# Patient Record
Sex: Female | Born: 1965 | Race: Black or African American | Hispanic: No | Marital: Married | Smoking: Never smoker
Health system: Southern US, Community
[De-identification: ages and names within clinical notes are randomized; demographics above are authoritative.]

## PROBLEM LIST (undated history)

## (undated) DIAGNOSIS — R569 Unspecified convulsions: Secondary | ICD-10-CM

## (undated) DIAGNOSIS — D649 Anemia, unspecified: Secondary | ICD-10-CM

## (undated) DIAGNOSIS — N939 Abnormal uterine and vaginal bleeding, unspecified: Secondary | ICD-10-CM

## (undated) DIAGNOSIS — R519 Headache, unspecified: Secondary | ICD-10-CM

## (undated) DIAGNOSIS — E785 Hyperlipidemia, unspecified: Secondary | ICD-10-CM

## (undated) DIAGNOSIS — I10 Essential (primary) hypertension: Secondary | ICD-10-CM

## (undated) DIAGNOSIS — G43909 Migraine, unspecified, not intractable, without status migrainosus: Secondary | ICD-10-CM

## (undated) DIAGNOSIS — R51 Headache: Secondary | ICD-10-CM

## (undated) HISTORY — DX: Migraine, unspecified, not intractable, without status migrainosus: G43.909

## (undated) HISTORY — DX: Hyperlipidemia, unspecified: E78.5

## (undated) HISTORY — DX: Essential (primary) hypertension: I10

## (undated) HISTORY — DX: Headache, unspecified: R51.9

## (undated) HISTORY — PX: TUBAL LIGATION: SHX77

## (undated) HISTORY — DX: Abnormal uterine and vaginal bleeding, unspecified: N93.9

## (undated) HISTORY — DX: Headache: R51

## (undated) HISTORY — DX: Unspecified convulsions: R56.9

---

## 2008-04-06 ENCOUNTER — Emergency Department (HOSPITAL_COMMUNITY): Admission: EM | Admit: 2008-04-06 | Discharge: 2008-04-06 | Payer: Self-pay | Admitting: Emergency Medicine

## 2011-04-28 LAB — URINALYSIS, ROUTINE W REFLEX MICROSCOPIC
Bilirubin Urine: NEGATIVE
Nitrite: NEGATIVE
Protein, ur: NEGATIVE
Specific Gravity, Urine: 1.02
Urobilinogen, UA: 0.2

## 2011-04-28 LAB — BASIC METABOLIC PANEL
CO2: 21
Calcium: 9.7
Creatinine, Ser: 0.98
Glucose, Bld: 148 — ABNORMAL HIGH

## 2011-04-28 LAB — CBC
MCHC: 33.5
RDW: 13.4

## 2011-04-28 LAB — DIFFERENTIAL
Basophils Absolute: 0
Basophils Relative: 0
Eosinophils Absolute: 0.1
Monocytes Absolute: 0.7
Neutro Abs: 7.3
Neutrophils Relative %: 69

## 2011-04-28 LAB — RAPID URINE DRUG SCREEN, HOSP PERFORMED
Opiates: NOT DETECTED
Tetrahydrocannabinol: NOT DETECTED

## 2013-12-14 ENCOUNTER — Ambulatory Visit: Payer: Self-pay | Admitting: Physician Assistant

## 2014-01-11 ENCOUNTER — Encounter: Payer: Self-pay | Admitting: Family

## 2014-01-11 ENCOUNTER — Ambulatory Visit (INDEPENDENT_AMBULATORY_CARE_PROVIDER_SITE_OTHER): Payer: PRIVATE HEALTH INSURANCE | Admitting: Family

## 2014-01-11 VITALS — BP 152/92 | HR 73 | Ht 64.0 in | Wt 174.0 lb

## 2014-01-11 DIAGNOSIS — B353 Tinea pedis: Secondary | ICD-10-CM

## 2014-01-11 DIAGNOSIS — R51 Headache: Secondary | ICD-10-CM

## 2014-01-11 DIAGNOSIS — Z0000 Encounter for general adult medical examination without abnormal findings: Secondary | ICD-10-CM

## 2014-01-11 LAB — CBC WITH DIFFERENTIAL/PLATELET
BASOS PCT: 0.3 % (ref 0.0–3.0)
Basophils Absolute: 0 10*3/uL (ref 0.0–0.1)
EOS PCT: 1 % (ref 0.0–5.0)
Eosinophils Absolute: 0.1 10*3/uL (ref 0.0–0.7)
HEMATOCRIT: 32.1 % — AB (ref 36.0–46.0)
Hemoglobin: 10.6 g/dL — ABNORMAL LOW (ref 12.0–15.0)
LYMPHS ABS: 2.1 10*3/uL (ref 0.7–4.0)
Lymphocytes Relative: 32.5 % (ref 12.0–46.0)
MCHC: 32.9 g/dL (ref 30.0–36.0)
MCV: 91.1 fl (ref 78.0–100.0)
MONO ABS: 0.4 10*3/uL (ref 0.1–1.0)
Monocytes Relative: 5.9 % (ref 3.0–12.0)
NEUTROS ABS: 3.8 10*3/uL (ref 1.4–7.7)
Neutrophils Relative %: 60.3 % (ref 43.0–77.0)
Platelets: 364 10*3/uL (ref 150.0–400.0)
RBC: 3.52 Mil/uL — AB (ref 3.87–5.11)
RDW: 15.4 % (ref 11.5–15.5)
WBC: 6.4 10*3/uL (ref 4.0–10.5)

## 2014-01-11 LAB — TSH: TSH: 1.6 u[IU]/mL (ref 0.35–4.50)

## 2014-01-11 LAB — POCT URINALYSIS DIPSTICK
Bilirubin, UA: NEGATIVE
Blood, UA: NEGATIVE
Glucose, UA: NEGATIVE
KETONES UA: NEGATIVE
LEUKOCYTES UA: NEGATIVE
Nitrite, UA: NEGATIVE
PH UA: 7
PROTEIN UA: NEGATIVE
Spec Grav, UA: 1.02
Urobilinogen, UA: 0.2

## 2014-01-11 LAB — COMPREHENSIVE METABOLIC PANEL
ALK PHOS: 88 U/L (ref 39–117)
ALT: 15 U/L (ref 0–35)
AST: 23 U/L (ref 0–37)
Albumin: 4.1 g/dL (ref 3.5–5.2)
BILIRUBIN TOTAL: 0.6 mg/dL (ref 0.2–1.2)
BUN: 10 mg/dL (ref 6–23)
CO2: 27 mEq/L (ref 19–32)
Calcium: 9.1 mg/dL (ref 8.4–10.5)
Chloride: 104 mEq/L (ref 96–112)
Creatinine, Ser: 0.8 mg/dL (ref 0.4–1.2)
GFR: 96.34 mL/min (ref >60.00)
GLUCOSE: 89 mg/dL (ref 70–99)
Potassium: 3.9 mEq/L (ref 3.5–5.1)
SODIUM: 135 meq/L (ref 135–145)
Total Protein: 7.8 g/dL (ref 6.0–8.3)

## 2014-01-11 LAB — LIPID PANEL
Cholesterol: 184 mg/dL (ref 0–200)
HDL: 65.6 mg/dL (ref >39.00)
LDL CALC: 111 mg/dL — AB (ref 0–99)
NONHDL: 118.4
Total CHOL/HDL Ratio: 3
Triglycerides: 38 mg/dL (ref 0.0–149.0)
VLDL: 7.6 mg/dL (ref 0.0–40.0)

## 2014-01-11 MED ORDER — TRAMADOL HCL 50 MG PO TABS: 50.0000 mg | ORAL_TABLET | Freq: Three times a day (TID) | ORAL | Status: DC | PRN

## 2014-01-11 NOTE — Progress Notes (Signed)
Pre visit review using our clinic review tool, if applicable. No additional management support is needed unless otherwise documented below in the visit note. 

## 2014-01-11 NOTE — Progress Notes (Signed)
Subjective:    Patient ID: Kimberly Walker, female    DOB: Jan 27, 1966, 48 y.o.   MRN: [id number]  HPI 48 year old AAF, nonsmoker, is in today to be established. She has complaints of headaches that occur about once or twice a month lasting 3-5 days. She takes ibuprofen 800 mg 5 times per day when they occur. They typically occur in the frontal region. Denies any associated nausea, vomiting, sensitivity to light or noise, no blurred vision. The headaches that he had of 8.  Patient has concerns of fungus in between her toes on going several month. Has not tried anything to help.She works in a factory and sweats a lot.    Review of Systems  Constitutional: Negative.   HENT: Negative for congestion, rhinorrhea and sinus pressure.   Respiratory: Negative.   Cardiovascular: Negative.   Gastrointestinal: Negative.   Endocrine: Negative.   Genitourinary: Negative.   Musculoskeletal: Negative.   Allergic/Immunologic: Negative.   Neurological: Positive for headaches. Negative for dizziness, weakness and light-headedness.  Hematological: Negative.   Psychiatric/Behavioral: Negative.    Past Medical History  Diagnosis Date  . Frequent headaches   . Hyperlipidemia   . Hypertension   . Seizure   . Migraine     History   Social History  . Marital Status: Married    Spouse Name: N/A    Number of Children: N/A  . Years of Education: N/A   Occupational History  . Not on file.   Social History Main Topics  . Smoking status: Never Smoker   . Smokeless tobacco: Not on file  . Alcohol Use: No  . Drug Use: No  . Sexual Activity: Not on file   Other Topics Concern  . Not on file   Social History Narrative  . No narrative on file    No past surgical history on file.  No family history on file.  Allergies not on file  No current outpatient prescriptions on file prior to visit.   No current facility-administered medications on file prior to visit.    BP 152/92  Pulse 73   Ht 5\' 4"  (1.626 m)  Wt 174 lb (78.926 kg)  BMI 29.85 kg/m2  SpO2 98%  LMP 05/31/2015chart    Objective:   Physical Exam  Constitutional: She is oriented to person, place, and time. She appears well-developed and well-nourished.  HENT:  Right Ear: External ear normal.  Left Ear: External ear normal.  Mouth/Throat: Oropharynx is clear and moist.  Neck: Normal range of motion. Neck supple.  Cardiovascular: Normal rate, regular rhythm and normal heart sounds.   Pulmonary/Chest: Effort normal and breath sounds normal.  Abdominal: Soft. Bowel sounds are normal.  Musculoskeletal: Normal range of motion.  Neurological: She is alert and oriented to person, place, and time.  Skin: Skin is warm and dry. Rash noted.  Greenish-white fungus noted between the toes.   Psychiatric: She has a normal mood and affect.          Assessment & Plan:   Problem List Items Addressed This Visit   None    Visit Diagnoses   Headache(784.0)    -  Primary    Relevant Medications       traMADol (ULTRAM) tablet 50 mg    Other Relevant Orders       CMP       TSH    Tinea pedis of both feet        Relevant Orders  CBC with Differential    Preventative health care        Relevant Orders       CMP       CBC with Differential       TSH       Lipid Panel       POCT urinalysis dipstick       Will do CPX at next OV. Obtain fasting labs today.

## 2014-01-11 NOTE — Patient Instructions (Addendum)
General Headache Without Cause A headache is pain or discomfort felt around the head or neck area. The specific cause of a headache may not be found. There are many causes and types of headaches. A few common ones are:  Tension headaches.  Migraine headaches.  Cluster headaches.  Chronic daily headaches. HOME CARE INSTRUCTIONS   Keep all follow-up appointments with your caregiver or any specialist referral.  Only take over-the-counter or prescription medicines for pain or discomfort as directed by your caregiver.  Lie down in a dark, quiet room when you have a headache.  Keep a headache journal to find out what may trigger your migraine headaches. For example, write down:  What you eat and drink.  How much sleep you get.  Any change to your diet or medicines.  Try massage or other relaxation techniques.  Put ice packs or heat on the head and neck. Use these 3 to 4 times per day for 15 to 20 minutes each time, or as needed.  Limit stress.  Sit up straight, and do not tense your muscles.  Quit smoking if you smoke.  Limit alcohol use.  Decrease the amount of caffeine you drink, or stop drinking caffeine.  Eat and sleep on a regular schedule.  Get 7 to 9 hours of sleep, or as recommended by your caregiver.  Keep lights dim if bright lights bother you and make your headaches worse. SEEK MEDICAL CARE IF:   You have problems with the medicines you were prescribed.  Your medicines are not working.  You have a change from the usual headache.  You have nausea or vomiting. SEEK IMMEDIATE MEDICAL CARE IF:   Your headache becomes severe.  You have a fever.  You have a stiff neck.  You have loss of vision.  You have muscular weakness or loss of muscle control.  You start losing your balance or have trouble walking.  You feel faint or pass out.  You have severe symptoms that are different from your first symptoms. MAKE SURE YOU:   Understand these  instructions.  Will watch your condition.  Will get help right away if you are not doing well or get worse. Document Released: 07/12/2005 Document Revised: 10/04/2011 Document Reviewed: 07/28/2011 [facility] Patient Information 2015 [place], [place]. This information is not intended to replace advice given to you by your health care provider. Make sure you discuss any questions you have with your health care provider.   Athlete's Foot Athlete's foot (tinea pedis) is a fungal infection of the skin on the feet. It often occurs on the skin between the toes or underneath the toes. It can also occur on the soles of the feet. Athlete's foot is more likely to occur in hot, humid weather. Not washing your feet or changing your socks often enough can contribute to athlete's foot. The infection can spread from person to person (contagious). CAUSES Athlete's foot is caused by a fungus. This fungus thrives in warm, moist places. Most people get athlete's foot by sharing shower stalls, towels, and wet floors with an infected person. People with weakened immune systems, including those with diabetes, may be more likely to get athlete's foot. SYMPTOMS   Itchy areas between the toes or on the soles of the feet.  White, flaky, or scaly areas between the toes or on the soles of the feet.  Tiny, intensely itchy blisters between the toes or on the soles of the feet.  Tiny cuts on the skin. These cuts can develop  a bacterial infection.  Thick or discolored toenails. DIAGNOSIS  Your caregiver can usually tell what the problem is by doing a physical exam. Your caregiver may also take a skin sample from the rash area. The skin sample may be examined under a microscope, or it may be tested to see if fungus will grow in the sample. A sample may also be taken from your toenail for testing. TREATMENT  Over-the-counter and prescription medicines can be used to kill the fungus. These medicines are available as powders or  creams. Your caregiver can suggest medicines for you. Fungal infections respond slowly to treatment. You may need to continue using your medicine for several weeks. PREVENTION   Do not share towels.  Wear sandals in wet areas, such as shared locker rooms and shared showers.  Keep your feet dry. Wear shoes that allow air to circulate. Wear cotton or wool socks. HOME CARE INSTRUCTIONS   Take medicines as directed by your caregiver. Do not use steroid creams on athlete's foot.  Keep your feet clean and cool. Wash your feet daily and dry them thoroughly, especially between your toes.  Change your socks every day. Wear cotton or wool socks. In hot climates, you may need to change your socks 2 to 3 times per day.  Wear sandals or canvas tennis shoes with good air circulation.  If you have blisters, soak your feet in Burow's solution or Epsom salts for 20 to 30 minutes, 2 times a day to dry out the blisters. Make sure you dry your feet thoroughly afterward. SEEK MEDICAL CARE IF:   You have a fever.  You have swelling, soreness, warmth, or redness in your foot.  You are not getting better after 7 days of treatment.  You are not completely cured after 30 days.  You have any problems caused by your medicines. MAKE SURE YOU:   Understand these instructions.  Will watch your condition.  Will get help right away if you are not doing well or get worse. Document Released: 07/09/2000 Document Revised: 10/04/2011 Document Reviewed: 04/30/2011 [facility] Patient Information 2015 [place], [place]. This information is not intended to replace advice given to you by your health care provider. Make sure you discuss any questions you have with your health care provider.

## 2014-02-15 ENCOUNTER — Ambulatory Visit: Payer: PRIVATE HEALTH INSURANCE | Admitting: Family

## 2014-02-22 ENCOUNTER — Ambulatory Visit (INDEPENDENT_AMBULATORY_CARE_PROVIDER_SITE_OTHER): Payer: PRIVATE HEALTH INSURANCE | Admitting: Family

## 2014-02-22 ENCOUNTER — Other Ambulatory Visit (HOSPITAL_COMMUNITY)
Admission: RE | Admit: 2014-02-22 | Discharge: 2014-02-22 | Disposition: A | Discharge status: Home / Self Care | Payer: PRIVATE HEALTH INSURANCE | Source: Ambulatory Visit | Attending: Family | Admitting: Family

## 2014-02-22 ENCOUNTER — Encounter: Payer: Self-pay | Admitting: Family

## 2014-02-22 VITALS — BP 154/82 | HR 80 | Temp 98.8°F | Ht 64.0 in | Wt 170.0 lb

## 2014-02-22 DIAGNOSIS — Z23 Encounter for immunization: Secondary | ICD-10-CM

## 2014-02-22 DIAGNOSIS — Z1239 Encounter for other screening for malignant neoplasm of breast: Secondary | ICD-10-CM

## 2014-02-22 DIAGNOSIS — Z124 Encounter for screening for malignant neoplasm of cervix: Secondary | ICD-10-CM

## 2014-02-22 DIAGNOSIS — Z01419 Encounter for gynecological examination (general) (routine) without abnormal findings: Secondary | ICD-10-CM | POA: Insufficient documentation

## 2014-02-22 DIAGNOSIS — D649 Anemia, unspecified: Secondary | ICD-10-CM

## 2014-02-22 DIAGNOSIS — I10 Essential (primary) hypertension: Secondary | ICD-10-CM

## 2014-02-22 DIAGNOSIS — Z0000 Encounter for general adult medical examination without abnormal findings: Secondary | ICD-10-CM

## 2014-02-22 DIAGNOSIS — Z1231 Encounter for screening mammogram for malignant neoplasm of breast: Secondary | ICD-10-CM

## 2014-02-22 LAB — CBC WITH DIFFERENTIAL/PLATELET
BASOS ABS: 0 10*3/uL (ref 0.0–0.1)
Basophils Relative: 0.4 % (ref 0.0–3.0)
EOS ABS: 0.1 10*3/uL (ref 0.0–0.7)
Eosinophils Relative: 1.1 % (ref 0.0–5.0)
HEMATOCRIT: 32.8 % — AB (ref 36.0–46.0)
Hemoglobin: 10.8 g/dL — ABNORMAL LOW (ref 12.0–15.0)
LYMPHS ABS: 2 10*3/uL (ref 0.7–4.0)
Lymphocytes Relative: 30.2 % (ref 12.0–46.0)
MCHC: 33 g/dL (ref 30.0–36.0)
MCV: 93.1 fl (ref 78.0–100.0)
MONO ABS: 0.3 10*3/uL (ref 0.1–1.0)
MONOS PCT: 5.3 % (ref 3.0–12.0)
Neutro Abs: 4.1 10*3/uL (ref 1.4–7.7)
Neutrophils Relative %: 63 % (ref 43.0–77.0)
PLATELETS: 302 10*3/uL (ref 150.0–400.0)
RBC: 3.52 Mil/uL — ABNORMAL LOW (ref 3.87–5.11)
RDW: 14.5 % (ref 11.5–15.5)
WBC: 6.6 10*3/uL (ref 4.0–10.5)

## 2014-02-22 NOTE — Patient Instructions (Signed)

## 2014-02-22 NOTE — Addendum Note (Signed)
Addended by: Townsend Roger D on: 02/22/2014 05:29 PM   Modules accepted: Orders

## 2014-02-22 NOTE — Progress Notes (Signed)
Pre visit review using our clinic review tool, if applicable. No additional management support is needed unless otherwise documented below in the visit note. 

## 2014-02-22 NOTE — Progress Notes (Signed)
Subjective:    Patient ID: Kimberly Walker, female    DOB: [birth year], 48 y.o.   MRN: [id number]  HPI  48 year old African American female, nonsmoker is in today for complete physical exam. Has a history of elevated blood pressure.  This is a routine wellness  examination for this patient . I reviewed all health maintenance protocols including mammography, colonoscopy, bone density Needed referrals were placed. Age and diagnosis  appropriate screening labs were ordered. Her immunization history was reviewed and appropriate vaccinations were ordered. Her current medications and allergies were reviewed and needed refills of her chronic medications were ordered. The plan for yearly health maintenance was discussed all orders and referrals were made as appropriate.  Review of Systems  Constitutional: Negative.   HENT: Negative.   Eyes: Negative.   Respiratory: Negative.   Cardiovascular: Negative.   Gastrointestinal: Negative.   Endocrine: Negative.   Genitourinary: Negative.   Musculoskeletal: Negative.   Skin: Negative.   Allergic/Immunologic: Negative.   Neurological: Negative.   Hematological: Negative.   Psychiatric/Behavioral: Negative.    Past Medical History  Diagnosis Date  . Frequent headaches   . Hyperlipidemia   . Hypertension   . Seizure   . Migraine     History   Social History  . Marital Status: Married    Spouse Name: N/A    Number of Children: N/A  . Years of Education: N/A   Occupational History  . Not on file.   Social History Main Topics  . Smoking status: Never Smoker   . Smokeless tobacco: Not on file  . Alcohol Use: No  . Drug Use: No  . Sexual Activity: Not on file   Other Topics Concern  . Not on file   Social History Narrative  . No narrative on file    History reviewed. No pertinent past surgical history.  No family history on file.  Not on File  Current Outpatient Prescriptions on File Prior to Visit  Medication Sig Dispense  Refill  . traMADol (ULTRAM) 50 MG tablet Take 1 tablet (50 mg total) by mouth every 8 (eight) hours as needed.  30 tablet  0   No current facility-administered medications on file prior to visit.    BP 154/82  Pulse 80  Temp(Src) 98.8 F (37.1 C) (Oral)  Ht 5\' 4"  (1.626 m)  Wt 170 lb (77.111 kg)  BMI 29.17 kg/m2chart     Objective:   Physical Exam  Constitutional: She is oriented to person, place, and time. She appears well-developed and well-nourished.  HENT:  Head: Normocephalic.  Right Ear: External ear normal.  Left Ear: External ear normal.  Nose: Nose normal.  Mouth/Throat: Oropharynx is clear and moist.  Eyes: Conjunctivae are normal. Pupils are equal, round, and reactive to light.  Neck: Normal range of motion. Neck supple. No thyromegaly present.  Cardiovascular: Normal rate, regular rhythm and normal heart sounds.   Pulmonary/Chest: Effort normal and breath sounds normal. Right breast exhibits no inverted nipple, no mass, no nipple discharge, no skin change and no tenderness. Left breast exhibits no inverted nipple, no mass, no nipple discharge, no skin change and no tenderness. Breasts are symmetrical.  Abdominal: Soft. Bowel sounds are normal.  Genitourinary: Vagina normal and uterus normal. Guaiac negative stool. No vaginal discharge found.  Musculoskeletal: Normal range of motion.  Neurological: She is alert and oriented to person, place, and time. She has normal reflexes.  Skin: Skin is warm and dry.  Psychiatric: She has a  normal mood and affect.          Assessment & Plan:  [name] was seen today for annual exam.  Diagnoses and associated orders for this visit:  Preventative health care - EKG 12-Lead - Ambulatory referral to Gastroenterology  Unspecified essential hypertension - EKG 12-Lead  Anemia, unspecified anemia type - CBC with Differential  Screening for malignant neoplasm of the cervix - PAP [[facility]]  Breast cancer screening, high  risk patient - MM Digital Screening; Future   Call the office with any questions or concerns. Recheck as scheduled, in 3 months and sooner as needed. May need blood pressure medication.

## 2014-02-25 ENCOUNTER — Telehealth: Payer: Self-pay | Admitting: Family

## 2014-02-25 LAB — CYTOLOGY - PAP

## 2014-02-25 NOTE — Telephone Encounter (Signed)
Relevant patient education mailed to patient.  

## 2014-03-05 ENCOUNTER — Ambulatory Visit: Payer: PRIVATE HEALTH INSURANCE

## 2014-03-15 ENCOUNTER — Encounter (INDEPENDENT_AMBULATORY_CARE_PROVIDER_SITE_OTHER): Payer: Self-pay

## 2014-03-15 ENCOUNTER — Ambulatory Visit
Admission: RE | Admit: 2014-03-15 | Discharge: 2014-03-15 | Disposition: A | Discharge status: Home / Self Care | Payer: PRIVATE HEALTH INSURANCE | Source: Ambulatory Visit | Attending: Family | Admitting: Family

## 2014-03-15 DIAGNOSIS — Z1239 Encounter for other screening for malignant neoplasm of breast: Secondary | ICD-10-CM

## 2014-03-26 ENCOUNTER — Encounter: Payer: Self-pay | Admitting: Family

## 2014-03-27 ENCOUNTER — Ambulatory Visit: Payer: PRIVATE HEALTH INSURANCE | Admitting: Family

## 2014-04-05 ENCOUNTER — Ambulatory Visit: Payer: PRIVATE HEALTH INSURANCE | Admitting: Family

## 2014-05-24 ENCOUNTER — Ambulatory Visit (INDEPENDENT_AMBULATORY_CARE_PROVIDER_SITE_OTHER): Payer: PRIVATE HEALTH INSURANCE | Admitting: Family

## 2014-05-24 ENCOUNTER — Encounter: Payer: Self-pay | Admitting: Family

## 2014-05-24 VITALS — BP 148/82 | HR 79 | Wt 166.4 lb

## 2014-05-24 DIAGNOSIS — I10 Essential (primary) hypertension: Secondary | ICD-10-CM

## 2014-05-24 DIAGNOSIS — D649 Anemia, unspecified: Secondary | ICD-10-CM

## 2014-05-24 DIAGNOSIS — Z23 Encounter for immunization: Secondary | ICD-10-CM

## 2014-05-24 LAB — COMPREHENSIVE METABOLIC PANEL
ALT: 16 U/L (ref 0–35)
AST: 21 U/L (ref 0–37)
Albumin: 3.5 g/dL (ref 3.5–5.2)
Alkaline Phosphatase: 70 U/L (ref 39–117)
BUN: 15 mg/dL (ref 6–23)
CALCIUM: 9.2 mg/dL (ref 8.4–10.5)
CHLORIDE: 105 meq/L (ref 96–112)
CO2: 23 meq/L (ref 19–32)
Creatinine, Ser: 0.9 mg/dL (ref 0.4–1.2)
GFR: 85.18 mL/min (ref >60.00)
Glucose, Bld: 83 mg/dL (ref 70–99)
POTASSIUM: 4.4 meq/L (ref 3.5–5.1)
SODIUM: 137 meq/L (ref 135–145)
TOTAL PROTEIN: 7.5 g/dL (ref 6.0–8.3)
Total Bilirubin: 0.6 mg/dL (ref 0.2–1.2)

## 2014-05-24 LAB — CBC WITH DIFFERENTIAL/PLATELET
BASOS ABS: 0 10*3/uL (ref 0.0–0.1)
Basophils Relative: 0.5 % (ref 0.0–3.0)
Eosinophils Absolute: 0.1 10*3/uL (ref 0.0–0.7)
Eosinophils Relative: 1.2 % (ref 0.0–5.0)
HCT: 34.4 % — ABNORMAL LOW (ref 36.0–46.0)
HEMOGLOBIN: 11.1 g/dL — AB (ref 12.0–15.0)
LYMPHS PCT: 27.2 % (ref 12.0–46.0)
Lymphs Abs: 2 10*3/uL (ref 0.7–4.0)
MCHC: 32.2 g/dL (ref 30.0–36.0)
MCV: 95.1 fl (ref 78.0–100.0)
MONOS PCT: 8.2 % (ref 3.0–12.0)
Monocytes Absolute: 0.6 10*3/uL (ref 0.1–1.0)
NEUTROS PCT: 62.9 % (ref 43.0–77.0)
Neutro Abs: 4.6 10*3/uL (ref 1.4–7.7)
PLATELETS: 271 10*3/uL (ref 150.0–400.0)
RBC: 3.61 Mil/uL — ABNORMAL LOW (ref 3.87–5.11)
RDW: 13.8 % (ref 11.5–15.5)
WBC: 7.3 10*3/uL (ref 4.0–10.5)

## 2014-05-24 MED ORDER — HYDROCHLOROTHIAZIDE 12.5 MG PO CAPS: 12.5000 mg | ORAL_CAPSULE | Freq: Every day | ORAL | Status: DC

## 2014-05-24 NOTE — Patient Instructions (Signed)

## 2014-05-24 NOTE — Progress Notes (Signed)
Pre visit review using our clinic review tool, if applicable. No additional management support is needed unless otherwise documented below in the visit note. 

## 2014-05-24 NOTE — Progress Notes (Signed)
   Subjective:    Patient ID: Kimberly Walker, female    DOB: 09/23/1965, 48 y.o.   MRN: [id number]  HPI  48 year old AAF, nonsmoker is in today for a recheck of elevated blood pressure. She continues to have elevated blood pressure. Does not exercise or follow any particular diet.  She was also found to be anemic at 10.6 at her last OV.   Review of Systems  Constitutional: Negative.   HENT: Negative.   Respiratory: Negative.   Cardiovascular: Negative.   Gastrointestinal: Negative.   Endocrine: Negative.   Genitourinary: Negative.   Musculoskeletal: Negative.   Skin: Negative.   Allergic/Immunologic: Negative.   Neurological: Negative.   Psychiatric/Behavioral: Negative.    Past Medical History  Diagnosis Date  . Frequent headaches   . Hyperlipidemia   . Hypertension   . Seizure   . Migraine     History   Social History  . Marital Status: Married    Spouse Name: N/A    Number of Children: N/A  . Years of Education: N/A   Occupational History  . Not on file.   Social History Main Topics  . Smoking status: Never Smoker   . Smokeless tobacco: Not on file  . Alcohol Use: No  . Drug Use: No  . Sexual Activity: Not on file   Other Topics Concern  . Not on file   Social History Narrative  . No narrative on file    History reviewed. No pertinent past surgical history.  No family history on file.  Not on File  Current Outpatient Prescriptions on File Prior to Visit  Medication Sig Dispense Refill  . traMADol (ULTRAM) 50 MG tablet Take 1 tablet (50 mg total) by mouth every 8 (eight) hours as needed.  30 tablet  0   No current facility-administered medications on file prior to visit.    BP 148/82  Pulse 79  Wt 166 lb 6.4 oz (75.479 kg)chart    Objective:   Physical Exam  Constitutional: She is oriented to person, place, and time. She appears well-developed and well-nourished.  HENT:  Right Ear: External ear normal.  Left Ear: External ear normal.    Nose: Nose normal.  Mouth/Throat: Oropharynx is clear and moist.  Neck: Normal range of motion. Neck supple. No thyromegaly present.  Cardiovascular: Normal rate, regular rhythm and normal heart sounds.   Pulmonary/Chest: Effort normal and breath sounds normal.  Abdominal: Soft. Bowel sounds are normal.  Musculoskeletal: Normal range of motion.  Neurological: She is alert and oriented to person, place, and time.  Skin: Skin is warm and dry.  Psychiatric: She has a normal mood and affect.          Assessment & Plan:  [name] was seen today for follow-up.  Diagnoses and associated orders for this visit:  Essential hypertension, benign - CBC with Differential - CMP  Encounter for immunization  Anemia, unspecified anemia type  Other Orders - Discontinue: hydrochlorothiazide (MICROZIDE) 12.5 MG capsule; Take 1 capsule (12.5 mg total) by mouth daily. - hydrochlorothiazide (MICROZIDE) 12.5 MG capsule; Take 1 capsule (12.5 mg total) by mouth daily.   Call the office with any questions or concerns. Recheck as scheduled, in 3 weeks and as needed.

## 2014-06-28 ENCOUNTER — Ambulatory Visit: Payer: PRIVATE HEALTH INSURANCE | Admitting: Family

## 2014-07-12 ENCOUNTER — Ambulatory Visit (INDEPENDENT_AMBULATORY_CARE_PROVIDER_SITE_OTHER): Payer: PRIVATE HEALTH INSURANCE | Admitting: Family

## 2014-07-12 ENCOUNTER — Encounter: Payer: Self-pay | Admitting: Family

## 2014-07-12 VITALS — BP 120/82 | HR 94 | Wt 163.0 lb

## 2014-07-12 DIAGNOSIS — D649 Anemia, unspecified: Secondary | ICD-10-CM

## 2014-07-12 DIAGNOSIS — I10 Essential (primary) hypertension: Secondary | ICD-10-CM

## 2014-07-12 DIAGNOSIS — G44209 Tension-type headache, unspecified, not intractable: Secondary | ICD-10-CM | POA: Insufficient documentation

## 2014-07-12 NOTE — Patient Instructions (Signed)

## 2014-07-12 NOTE — Progress Notes (Signed)
   Subjective:    Patient ID: Kimberly Walker, female    DOB: 1966-01-06, 48 y.o.   MRN: [id number]  HPI 48 year old African-American female, nonsmoker is in today for recheck of hypertension. Reports doing well. Taking hydrochlorothiazide 12.5 g once daily and tolerating it well.   Review of Systems  Constitutional: Negative.   HENT: Negative.   Respiratory: Negative.   Cardiovascular: Negative.   Gastrointestinal: Negative.   Endocrine: Negative.   Genitourinary: Negative.   Neurological: Negative.   All other systems reviewed and are negative.  Past Medical History  Diagnosis Date  . Frequent headaches   . Hyperlipidemia   . Hypertension   . Seizure   . Migraine     History   Social History  . Marital Status: Married    Spouse Name: N/A    Number of Children: N/A  . Years of Education: N/A   Occupational History  . Not on file.   Social History Main Topics  . Smoking status: Never Smoker   . Smokeless tobacco: Not on file  . Alcohol Use: No  . Drug Use: No  . Sexual Activity: Not on file   Other Topics Concern  . Not on file   Social History Narrative    History reviewed. No pertinent past surgical history.  History reviewed. No pertinent family history.  No Known Allergies  Current Outpatient Prescriptions on File Prior to Visit  Medication Sig Dispense Refill  . hydrochlorothiazide (MICROZIDE) 12.5 MG capsule Take 1 capsule (12.5 mg total) by mouth daily. 30 capsule 2  . traMADol (ULTRAM) 50 MG tablet Take 1 tablet (50 mg total) by mouth every 8 (eight) hours as needed. 30 tablet 0   No current facility-administered medications on file prior to visit.    BP 120/82 mmHg  Pulse 94  Wt 163 lb (73.936 kg)chart    Objective:   Physical Exam  Constitutional: She is oriented to person, place, and time. She appears well-developed and well-nourished.  Neck: Normal range of motion. Neck supple.  Cardiovascular: Normal rate, regular rhythm and normal  heart sounds.   Recheck blood pressure: 130/72  Pulmonary/Chest: Effort normal and breath sounds normal.  Abdominal: Soft. Bowel sounds are normal.  Musculoskeletal: Normal range of motion.  Neurological: She is alert and oriented to person, place, and time.  Skin: Skin is warm and dry.  Psychiatric: She has a normal mood and affect.          Assessment & Plan:  Kimberly Walker was seen today for follow-up.  Diagnoses and associated orders for this visit:  Essential hypertension  Anemia, unspecified anemia type    Continue current medication. Encouraged healthy diet, exercise. Recheck in 6 months and sooner as needed.

## 2014-07-12 NOTE — Progress Notes (Signed)
Pre visit review using our clinic review tool, if applicable. No additional management support is needed unless otherwise documented below in the visit note. 

## 2014-08-22 ENCOUNTER — Telehealth: Payer: Self-pay | Admitting: Family

## 2014-08-22 NOTE — Telephone Encounter (Signed)
Pt request refill of the following: hydrochlorothiazide (MICROZIDE) 12.5 MG capsule   Kimberly Walker

## 2014-08-23 MED ORDER — HYDROCHLOROTHIAZIDE 12.5 MG PO CAPS: 12.5000 mg | ORAL_CAPSULE | Freq: Every day | ORAL | Status: DC

## 2014-08-23 NOTE — Telephone Encounter (Signed)
Done

## 2015-01-10 ENCOUNTER — Ambulatory Visit: Payer: PRIVATE HEALTH INSURANCE | Admitting: Family

## 2015-03-16 ENCOUNTER — Other Ambulatory Visit: Payer: Self-pay | Admitting: Family

## 2015-03-18 ENCOUNTER — Other Ambulatory Visit: Payer: Self-pay

## 2015-03-18 DIAGNOSIS — Z1231 Encounter for screening mammogram for malignant neoplasm of breast: Secondary | ICD-10-CM

## 2015-04-04 ENCOUNTER — Ambulatory Visit: Payer: PRIVATE HEALTH INSURANCE

## 2015-06-13 ENCOUNTER — Ambulatory Visit
Admission: RE | Admit: 2015-06-13 | Discharge: 2015-06-13 | Disposition: A | Discharge status: Home / Self Care | Payer: PRIVATE HEALTH INSURANCE | Source: Ambulatory Visit

## 2015-06-13 DIAGNOSIS — Z1231 Encounter for screening mammogram for malignant neoplasm of breast: Secondary | ICD-10-CM

## 2015-06-17 ENCOUNTER — Telehealth: Payer: Self-pay | Admitting: Family

## 2015-06-17 MED ORDER — HYDROCHLOROTHIAZIDE 12.5 MG PO CAPS: ORAL_CAPSULE | ORAL | Status: DC

## 2015-06-17 NOTE — Telephone Encounter (Signed)
Rx was sent  

## 2015-06-17 NOTE — Telephone Encounter (Signed)
Patient would like her Hydrochlorothiazide 12.5 capsule medication refilled.

## 2015-06-20 ENCOUNTER — Other Ambulatory Visit: Payer: Self-pay | Admitting: Family

## 2015-09-08 ENCOUNTER — Ambulatory Visit (INDEPENDENT_AMBULATORY_CARE_PROVIDER_SITE_OTHER): Payer: PRIVATE HEALTH INSURANCE | Admitting: Family Medicine

## 2015-09-08 ENCOUNTER — Encounter: Payer: Self-pay | Admitting: Family Medicine

## 2015-09-08 VITALS — BP 118/70 | HR 85 | Temp 98.1°F | Ht 64.0 in | Wt 166.4 lb

## 2015-09-08 DIAGNOSIS — N939 Abnormal uterine and vaginal bleeding, unspecified: Secondary | ICD-10-CM

## 2015-09-08 DIAGNOSIS — D649 Anemia, unspecified: Secondary | ICD-10-CM | POA: Diagnosis not present

## 2015-09-08 DIAGNOSIS — I10 Essential (primary) hypertension: Secondary | ICD-10-CM

## 2015-09-08 LAB — BASIC METABOLIC PANEL
BUN: 16 mg/dL (ref 6–23)
CO2: 29 mEq/L (ref 19–32)
Calcium: 9.2 mg/dL (ref 8.4–10.5)
Chloride: 102 mEq/L (ref 96–112)
Creatinine, Ser: 0.81 mg/dL (ref 0.40–1.20)
GFR: 95.7 mL/min (ref >60.00)
GLUCOSE: 137 mg/dL — AB (ref 70–99)
POTASSIUM: 3.9 meq/L (ref 3.5–5.1)
SODIUM: 138 meq/L (ref 135–145)

## 2015-09-08 LAB — VITAMIN B12: VITAMIN B 12: 506 pg/mL (ref 211–911)

## 2015-09-08 LAB — CBC WITH DIFFERENTIAL/PLATELET
Basophils Absolute: 0 10*3/uL (ref 0.0–0.1)
Basophils Relative: 0.3 % (ref 0.0–3.0)
EOS PCT: 1 % (ref 0.0–5.0)
Eosinophils Absolute: 0.1 10*3/uL (ref 0.0–0.7)
HEMATOCRIT: 33.6 % — AB (ref 36.0–46.0)
HEMOGLOBIN: 11 g/dL — AB (ref 12.0–15.0)
LYMPHS ABS: 1.7 10*3/uL (ref 0.7–4.0)
Lymphocytes Relative: 20.9 % (ref 12.0–46.0)
MCHC: 32.6 g/dL (ref 30.0–36.0)
MCV: 95.1 fl (ref 78.0–100.0)
MONOS PCT: 5.7 % (ref 3.0–12.0)
Monocytes Absolute: 0.5 10*3/uL (ref 0.1–1.0)
Neutro Abs: 5.7 10*3/uL (ref 1.4–7.7)
Neutrophils Relative %: 72.1 % (ref 43.0–77.0)
Platelets: 308 10*3/uL (ref 150.0–400.0)
RBC: 3.53 Mil/uL — AB (ref 3.87–5.11)
RDW: 13.8 % (ref 11.5–15.5)
WBC: 8 10*3/uL (ref 4.0–10.5)

## 2015-09-08 LAB — TSH: TSH: 2.08 u[IU]/mL (ref 0.35–4.50)

## 2015-09-08 MED ORDER — HYDROCHLOROTHIAZIDE 12.5 MG PO CAPS: 12.5000 mg | ORAL_CAPSULE | Freq: Every day | ORAL | Status: DC

## 2015-09-08 NOTE — Patient Instructions (Signed)
BEFORE YOU LEAVE: -labs -schedule physical exam in about 3-4 months  -We placed a referral for you to the gynecologist regarding the abnormal bleeding as discussed. It usually takes about 1-2 weeks to process and schedule this referral. If you have not heard from [place] regarding this appointment in 2 weeks please contact our office.  -We have ordered labs or studies at this visit. It can take up to 1-2 weeks for results and processing. We will contact you with instructions IF your results are abnormal. Normal results will be released to your [facility]. If you have not heard from [place] or can not find your results in [facility] in 2 weeks please contact our office.  -please let us know if you want to do the cologuard or the colonoscopy  We recommend the following healthy lifestyle measures: - eat a healthy whole foods diet consisting of regular small meals composed of vegetables, fruits, beans, nuts, seeds, healthy meats such as white chicken and fish and whole grains.  - avoid sweets, white starchy foods, fried foods, fast food, processed foods, sodas, red meet and other fattening foods.  - get a least 150-300 minutes of aerobic exercise per week.

## 2015-09-08 NOTE — Progress Notes (Signed)
HPI:  Kimberly Walker is here to establish care. She used to see Padonda, but has not been seen in > 1 year.   Has the following chronic problems that require follow up and concerns today:  Hypertension: -chronic -meds: Hydrochlorthiazide 12.5 mg daily -denies: CP, SOB, DOE -no regular exercise, diet so so -needs refills  Anemia: -per prior PCP notes was to take iron for this -on labs as far back as 2009, normocytic  Hx migraines and frequent headaches: -chronic -stable -uses OTC medications rarely  Abnormal uterine bleeding: -she reports for maybe 6-12 months -spotting and bleeding between periods -denies: abd pain, weight loss, fevers, malaise  Declines all vaccines, HIV or hep c screening Agrees to consider colon cancer screening but declines today  ROS negative for unless reported above: fevers, unintentional weight loss, hearing or vision loss, chest pain, palpitations, struggling to breath, hemoptysis, melena, hematochezia, hematuria, falls, loc, si, thoughts of self harm  Past Medical History  Diagnosis Date  . Frequent headaches   . Hyperlipidemia   . Hypertension   . Seizure ([place])   . Migraine     History reviewed. No pertinent past surgical history.  History reviewed. No pertinent family history.  Social History   Social History  . Marital Status: Married    Spouse Name: N/A  . Number of Children: N/A  . Years of Education: N/A   Social History Main Topics  . Smoking status: Never Smoker   . Smokeless tobacco: None  . Alcohol Use: No  . Drug Use: No  . Sexual Activity: Not Asked   Other Topics Concern  . None   Social History Narrative     Current outpatient prescriptions:  .  hydrochlorothiazide (MICROZIDE) 12.5 MG capsule, Take 1 capsule (12.5 mg total) by mouth daily., Disp: 90 capsule, Rfl: 3  EXAM:  Filed Vitals:   09/08/15 1120  BP: 118/70  Pulse: 85  Temp: 98.1 F (36.7 C)    Body mass index is 28.55  kg/(m^2).  GENERAL: vitals reviewed and listed above, alert, oriented, appears well hydrated and in no acute distress  HEENT: atraumatic, conjunttiva clear, no obvious abnormalities on inspection of external nose and ears  NECK: no obvious masses on inspection  LUNGS: clear to auscultation bilaterally, no wheezes, rales or rhonchi, good air movement  CV: HRRR, no peripheral edema  MS: moves all extremities without noticeable abnormality  PSYCH: pleasant and cooperative, no obvious depression or anxiety  ASSESSMENT AND PLAN:  Discussed the following assessment and plan:  Essential hypertension - Plan: Basic metabolic panel -refilled meds -lifestyle recs  Anemia, unspecified anemia type - Plan: CBC with Differential, Vitamin B12, TSH  Abnormal uterine bleeding - Plan: Ambulatory referral to Gynecology, TSH  -we discussed possible serious and likely etiologies, workup and treatment, treatment risks and return precautions and advise gyn evaluation  -after this discussion, Kimberly Walker opted for evaluation with gynecologist -of course, we advised Kimberly Walker  to return or notify a doctor immediately if symptoms worsen or persist or new concerns arise.  -We reviewed the PMH, PSH, FH, SH, Meds and Allergies. -We provided refills for any medications we will prescribe as needed. -We addressed current concerns per orders and patient instructions. -We have asked for records for pertinent exams, studies, vaccines and notes from previous providers. -We have advised patient to follow up per instructions below.   -Patient advised to return or notify a doctor immediately if symptoms worsen or persist or new concerns arise.  Patient  Instructions  BEFORE YOU LEAVE: -labs -schedule physical exam in about 3-4 months  -We placed a referral for you to the gynecologist regarding the abnormal bleeding as discussed. It usually takes about 1-2 weeks to process and schedule this referral. If you have not  heard from [place] regarding this appointment in 2 weeks please contact our office.  -We have ordered labs or studies at this visit. It can take up to 1-2 weeks for results and processing. We will contact you with instructions IF your results are abnormal. Normal results will be released to your [facility] If you have not heard from [place] or can not find your results in [facility] in 2 weeks please contact our office.  -please let us know if you want to do the cologuard or the colonoscopy  We recommend the following healthy lifestyle measures: - eat a healthy whole foods diet consisting of regular small meals composed of vegetables, fruits, beans, nuts, seeds, healthy meats such as white chicken and fish and whole grains.  - avoid sweets, white starchy foods, fried foods, fast food, processed foods, sodas, red meet and other fattening foods.  - get a least 150-300 minutes of aerobic exercise per week.              Colin Benton R.

## 2015-09-08 NOTE — Progress Notes (Signed)
Pre visit review using our clinic review tool, if applicable. No additional management support is needed unless otherwise documented below in the visit note. 

## 2015-09-09 ENCOUNTER — Telehealth: Payer: Self-pay | Admitting: Obstetrics and Gynecology

## 2015-09-09 NOTE — Telephone Encounter (Signed)
Called and left a message for patient to call back to schedule a new patient doctor referral. °

## 2015-09-10 NOTE — Telephone Encounter (Signed)
Called and left a message for patient to call back to schedule a new patient doctor referral. °

## 2015-09-11 NOTE — Telephone Encounter (Signed)
Called and left a message for patient to call back to schedule a new patient doctor referral. °

## 2015-09-11 NOTE — Telephone Encounter (Signed)
Left patient a message to call back if she is interested in moving her appointment up to 09/26/15 with Dr. Quincy Simmonds.

## 2015-09-18 ENCOUNTER — Ambulatory Visit: Payer: Self-pay | Admitting: Obstetrics and Gynecology

## 2015-09-26 ENCOUNTER — Ambulatory Visit: Payer: Self-pay | Admitting: Obstetrics and Gynecology

## 2015-10-01 ENCOUNTER — Encounter: Payer: Self-pay | Admitting: Obstetrics and Gynecology

## 2015-10-01 ENCOUNTER — Ambulatory Visit (INDEPENDENT_AMBULATORY_CARE_PROVIDER_SITE_OTHER): Payer: PRIVATE HEALTH INSURANCE | Admitting: Obstetrics and Gynecology

## 2015-10-01 ENCOUNTER — Telehealth: Payer: Self-pay | Admitting: Obstetrics and Gynecology

## 2015-10-01 VITALS — BP 118/64 | HR 70 | Resp 16 | Ht 64.0 in | Wt 165.2 lb

## 2015-10-01 DIAGNOSIS — N921 Excessive and frequent menstruation with irregular cycle: Secondary | ICD-10-CM

## 2015-10-01 LAB — POCT URINE PREGNANCY: Preg Test, Ur: NEGATIVE

## 2015-10-01 NOTE — Telephone Encounter (Signed)
Spoke with patient regarding benefits. Pt to check her work schedule and call back to schedule

## 2015-10-01 NOTE — Progress Notes (Signed)
Patient ID: Kimberly [name], female   DOB: [birth year], 50 y.o.   MRN: [id number] GYNECOLOGY  VISIT   HPI: 50 y.o.   Married  [place] American  female   [phone] with Patient's last menstrual period was 09/03/2015 (exact date).   here for abnormal uterine bleeding for the past six months. Patient states she has been bleeding in between cycles and occasionally skipping a cycle.   Has skipped menses in the past.   Menses last 6 months are irregular. Spotting in between cycles.  Menses 08/09/15, 09/03/15, 09/24/15. Bleeding usually lasts 5 - 6 days.  Heavy flow with pad plus tampon change every 2 - 3 hours. Has cramping and Advil works to treat this.   Has periodic hot flashes, which have resolved.  Told she was anemic by her PCP.  Hgb 11, three weeks ago.  TSH normal.   Taking iron over the counter once a day.   Has headaches which are not really migraines.  Denies aura.   Works in Arts development officer - hosiery.   UPT negative today.  PCP - Dr. Koren Bound  GYNECOLOGIC HISTORY: Patient's last menstrual period was 09/03/2015 (exact date). Contraception:Tubal ligation Menopausal hormone therapy: n/a Last mammogram: 06-13-15 Density Cat.D/Neg/BiRads1:[facility] Last pap smear: 02-22-14 Neg        OB History    Gravida Para Term Preterm AB TAB SAB Ectopic Multiple Living   2 2 2       2          Patient Active Problem List   Diagnosis Date Noted  . Absolute anemia 09/08/2015  . Essential hypertension 07/12/2014  . Tension headache 07/12/2014    Past Medical History  Diagnosis Date  . Frequent headaches   . Hyperlipidemia   . Hypertension   . Seizure ([place])   . Migraine   . Abnormal uterine bleeding     Past Surgical History  Procedure Laterality Date  . Tubal ligation    . Cesarean section  1988    Current Outpatient Prescriptions  Medication Sig Dispense Refill  . ferrous sulfate 325 (65 FE) MG tablet Take 325 mg by mouth daily with breakfast.    .  hydrochlorothiazide (MICROZIDE) 12.5 MG capsule Take 1 capsule (12.5 mg total) by mouth daily. 90 capsule 3   No current facility-administered medications for this visit.     ALLERGIES: Review of patient's allergies indicates no known allergies.  Family History  Problem Relation Age of Onset  . Diabetes Mother   . Hypertension Mother     Social History   Social History  . Marital Status: Married    Spouse Name: N/A  . Number of Children: N/A  . Years of Education: N/A   Occupational History  . Not on file.   Social History Main Topics  . Smoking status: Never Smoker   . Smokeless tobacco: Not on file  . Alcohol Use: No  . Drug Use: No  . Sexual Activity:    Partners: Male    Birth Control/ Protection: Surgical     Comment: Tubal   Other Topics Concern  . Not on file   Social History Narrative   Updated 08/2015   Work or School: production - boarding [employer]      Home Situation: Lives with husband - feels safe at home      Spiritual Beliefs: Christian      Lifestyle: no regular aerobic exercise, diet so so  ROS:  Pertinent items are noted in HPI.  PHYSICAL EXAMINATION:    BP 118/64 mmHg  Pulse 70  Resp 16  Ht 5\' 4"  (1.626 m)  Wt 165 lb 3.2 oz (74.934 kg)  BMI 28.34 kg/m2  LMP 09/03/2015 (Exact Date)    General appearance: alert, cooperative and appears stated age Head: Normocephalic, without obvious abnormality, atraumatic Neck: no adenopathy, supple, symmetrical, trachea midline and thyroid normal to inspection and palpation Lungs: clear to auscultation bilaterally   Heart: regular rate and rhythm Abdomen: soft, non-tender; bowel sounds normal; no masses,  no organomegaly Extremities: extremities normal, atraumatic, no cyanosis or edema Skin: Skin color, texture, turgor normal. No rashes or lesions Lymph nodes: Cervical, supraclavicular, and axillary nodes normal. No abnormal inguinal nodes palpated Neurologic: Grossly  normal  Pelvic: External genitalia:  no lesions              Urethra:  normal appearing urethra with no masses, tenderness or lesions              Bartholins and Skenes: normal                 Vagina: normal appearing vagina with normal color and discharge, no lesions              Cervix: no lesions              Pap taken: No. Bimanual Exam:  Uterus:   Enlarged to 14 week size, but difficulty [name] to patient difficulty to relax.              Adnexa: difficult to palpate separately form uterus.               Rectovaginal: Yes.  .  Confirms.              Anus:  normal sphincter tone, no lesions  Chaperone was present for exam.  ASSESSMENT  Menorrhagia with irregular cycles. Possible uterine fibroids.  Inadequate pelvic exam.  Anemia. Status post BTL.   PLAN  Counseled regarding etiologies of abnormal uterine bleeding.  I suspect patient has fibroids and is perimenopausal.  Return for pelvic ultrasound.  Will then address future potential EMB.     An After Visit Summary was printed and given to the patient.  __30____ minutes face to face time of which over 50% was spent in counseling.

## 2015-10-01 NOTE — Telephone Encounter (Signed)
Called patient to review benefits for a recommended procedure. Left Voicemail requesting a call back. °

## 2015-10-01 NOTE — Patient Instructions (Signed)

## 2015-10-02 NOTE — Telephone Encounter (Signed)
Patient's daughter Warren Lacy is calling to speak with Deloris Ping and schedule her mom's appointment. She is on the dpr to speak with her.

## 2015-10-02 NOTE — Telephone Encounter (Signed)
Spoke with patients daughter, Durene Fruits (she is on 09/30/15 DPR) she call to clarify benefit information. Daughter to have mother to call and schedule appt

## 2015-10-03 ENCOUNTER — Ambulatory Visit: Payer: Self-pay | Admitting: Obstetrics and Gynecology

## 2015-10-09 ENCOUNTER — Ambulatory Visit (INDEPENDENT_AMBULATORY_CARE_PROVIDER_SITE_OTHER): Payer: PRIVATE HEALTH INSURANCE | Admitting: Obstetrics and Gynecology

## 2015-10-09 ENCOUNTER — Ambulatory Visit (INDEPENDENT_AMBULATORY_CARE_PROVIDER_SITE_OTHER): Payer: PRIVATE HEALTH INSURANCE

## 2015-10-09 ENCOUNTER — Encounter: Payer: Self-pay | Admitting: Obstetrics and Gynecology

## 2015-10-09 VITALS — BP 158/90 | HR 76 | Ht 64.0 in | Wt 166.0 lb

## 2015-10-09 DIAGNOSIS — N921 Excessive and frequent menstruation with irregular cycle: Secondary | ICD-10-CM | POA: Diagnosis not present

## 2015-10-09 DIAGNOSIS — N939 Abnormal uterine and vaginal bleeding, unspecified: Secondary | ICD-10-CM

## 2015-10-09 DIAGNOSIS — N83201 Unspecified ovarian cyst, right side: Secondary | ICD-10-CM | POA: Diagnosis not present

## 2015-10-09 DIAGNOSIS — D259 Leiomyoma of uterus, unspecified: Secondary | ICD-10-CM

## 2015-10-09 NOTE — Progress Notes (Signed)
Subjective  50 y.o. G12P2002 Married [place] American female here for pelvic ultrasound for bleeding in between cycles. Daughter who is a Marine scientist at [employer] in orthopedics is present for discussion today.   Patient states she has been bleeding in between cycles and occasionally skipping a cycle.  Has skipped menses in the past.  Menses last 6 months are irregular. Spotting in between cycles.  Menses 08/09/15, 09/03/15, 09/24/15. Bleeding usually lasts 5 - 6 days.  Heavy flow with pad plus tampon change every 2 - 3 hours. Has cramping and Advil works to treat this.   Patient's last menstrual period was 09/25/2015 (approximate).  Objective  Pelvic ultrasound images and report reviewed with patient.  Uterus - 2 fibroids, largest 3.4 cm encroaching on endometrium. EMS - 10.59 mm. Ovaries - right ovary with solid areas - 20 x 17 mm and 22 x 18 mm - echogenic, avascular, smooth; also 16 mm follicle.  Left ovary normal.  Free fluid - no       Assessment  Irregular menstrual cycle.  Uterine fibroids - one partially submucous.  Right ovarian cyst with solid component.  ? Etiology.    Plan  Discussion of irregular menses - etiologies and importance of proceeding with appt for endometrial biopsy to rule out polyps, infection, hyperplasia, and even cancer.  I have clearly indicated the importance of proceeding with the biopsy and the patient and daughter indicate understanding. Discussion of fibroids - etiology, symptoms, treatment options discussed - birth control, uterine artery embolization, hysterectomy. Discussion of ovarian cysts.  Will check CA125.  Final plan to follow. Possibility of surgical care needed. At a minimum, patient will need an EMB and follow up ultrasound in 6 weeks to recheck the right ovary.  May do this at [facility] or [place].  ___25____ minutes face to face time of which over 50% was spent in counseling.   After visit summary to patient.

## 2015-10-09 NOTE — Patient Instructions (Signed)
Ovarian Cyst An ovarian cyst is a fluid-filled sac that forms on an ovary. The ovaries are small organs that produce eggs in women. Various types of cysts can form on the ovaries. Most are not cancerous. Many do not cause problems, and they often go away on their own. Some may cause symptoms and require treatment. Common types of ovarian cysts include:  Functional cysts--These cysts may occur every month during the menstrual cycle. This is normal. The cysts usually go away with the next menstrual cycle if the woman does not get pregnant. Usually, there are no symptoms with a functional cyst.  Endometrioma cysts--These cysts form from the tissue that lines the uterus. They are also called "chocolate cysts" because they become filled with blood that turns [place]. This type of cyst can cause pain in the lower abdomen during intercourse and with your menstrual period.  Cystadenoma cysts--This type develops from the cells on the outside of the ovary. These cysts can get very big and cause lower abdomen pain and pain with intercourse. This type of cyst can twist on itself, cut off its blood supply, and cause severe pain. It can also easily rupture and cause a lot of pain.  Dermoid cysts--This type of cyst is sometimes found in both ovaries. These cysts may contain different kinds of body tissue, such as skin, teeth, hair, or cartilage. They usually do not cause symptoms unless they get very big.  Theca lutein cysts--These cysts occur when too much of a certain hormone (human chorionic gonadotropin) is produced and overstimulates the ovaries to produce an egg. This is most common after procedures used to assist with the conception of a baby (in vitro fertilization). CAUSES   Fertility drugs can cause a condition in which multiple large cysts are formed on the ovaries. This is called ovarian hyperstimulation syndrome.  A condition called polycystic ovary syndrome can cause hormonal imbalances that can lead to  nonfunctional ovarian cysts. SIGNS AND SYMPTOMS  Many ovarian cysts do not cause symptoms. If symptoms are present, they may include:  Pelvic pain or pressure.  Pain in the lower abdomen.  Pain during sexual intercourse.  Increasing girth (swelling) of the abdomen.  Abnormal menstrual periods.  Increasing pain with menstrual periods.  Stopping having menstrual periods without being pregnant. DIAGNOSIS  These cysts are commonly found during a routine or annual pelvic exam. Tests may be ordered to find out more about the cyst. These tests may include:  Ultrasound.  X-ray of the pelvis.  CT scan.  MRI.  Blood tests. TREATMENT  Many ovarian cysts go away on their own without treatment. Your health care provider may want to check your cyst regularly for 2-3 months to see if it changes. For women in menopause, it is particularly important to monitor a cyst closely because of the higher rate of ovarian cancer in menopausal women. When treatment is needed, it may include any of the following:  A procedure to drain the cyst (aspiration). This may be done using a long needle and ultrasound. It can also be done through a laparoscopic procedure. This involves using a thin, lighted tube with a tiny camera on the end (laparoscope) inserted through a small incision.  Surgery to remove the whole cyst. This may be done using laparoscopic surgery or an open surgery involving a larger incision in the lower abdomen.  Hormone treatment or birth control pills. These methods are sometimes used to help dissolve a cyst. HOME CARE INSTRUCTIONS   Only take over-the-counter   or prescription medicines as directed by your health care provider.  Follow up with your health care provider as directed.  Get regular pelvic exams and Pap tests. SEEK MEDICAL CARE IF:   Your periods are late, irregular, or painful, or they stop.  Your pelvic pain or abdominal pain does not go away.  Your abdomen becomes  larger or swollen.  You have pressure on your bladder or trouble emptying your bladder completely.  You have pain during sexual intercourse.  You have feelings of fullness, pressure, or discomfort in your stomach.  You lose weight for no apparent reason.  You feel generally ill.  You become constipated.  You lose your appetite.  You develop acne.  You have an increase in body and facial hair.  You are gaining weight, without changing your exercise and eating habits.  You think you are pregnant. SEEK IMMEDIATE MEDICAL CARE IF:   You have increasing abdominal pain.  You feel sick to your stomach (nauseous), and you throw up (vomit).  You develop a fever that comes on suddenly.  You have abdominal pain during a bowel movement.  Your menstrual periods become heavier than usual. MAKE SURE YOU:  Understand these instructions.  Will watch your condition.  Will get help right away if you are not doing well or get worse.   This information is not intended to replace advice given to you by your health care provider. Make sure you discuss any questions you have with your health care provider.   Document Released: 07/12/2005 Document Revised: 07/17/2013 Document Reviewed: 03/19/2013 Elsevier Interactive Patient Education 2016 [place].  Endometrial Biopsy Endometrial biopsy is a procedure in which a tissue sample is taken from inside the uterus. The tissue sample is then looked at under a microscope to see if the tissue is normal or abnormal. The endometrium is the lining of the uterus. This procedure helps determine where you are in your menstrual cycle and how hormone levels are affecting the lining of the uterus. This procedure may also be used to evaluate uterine bleeding or to diagnose endometrial cancer, tuberculosis, polyps, or inflammatory conditions.  LET [facility] CARE PROVIDER KNOW ABOUT:  Any allergies you have.  All medicines you are taking, including  vitamins, herbs, eye drops, creams, and over-the-counter medicines.  Previous problems you or members of your family have had with the use of anesthetics.  Any blood disorders you have.  Previous surgeries you have had.  Medical conditions you have.  Possibility of pregnancy. RISKS AND COMPLICATIONS Generally, this is a safe procedure. However, as with any procedure, complications can occur. Possible complications include:  Bleeding.  Pelvic infection.  Puncture of the uterine wall with the biopsy device (rare). BEFORE THE PROCEDURE   Keep a record of your menstrual cycles as directed by your health care provider. You may need to schedule your procedure for a specific time in your cycle.  You may want to bring a sanitary pad to wear home after the procedure.  Arrange for someone to drive you home after the procedure if you will be given a medicine to help you relax (sedative). PROCEDURE   You may be given a sedative to relax you.  You will lie on an exam table with your feet and legs supported as in a pelvic exam.  Your health care provider will insert an instrument (speculum) into your vagina to see your cervix.  Your cervix will be cleansed with an antiseptic solution. A medicine (local anesthetic) will  be used to numb the cervix.  A forceps instrument (tenaculum) will be used to hold your cervix steady for the biopsy.  A thin, rodlike instrument (uterine sound) will be inserted through your cervix to determine the length of your uterus and the location where the biopsy sample will be removed.  A thin, flexible tube (catheter) will be inserted through your cervix and into the uterus. The catheter is used to collect the biopsy sample from your endometrial tissue.  The catheter and speculum will then be removed, and the tissue sample will be sent to a lab for examination. AFTER THE PROCEDURE  You will rest in a recovery area until you are ready to go home.  You may  have mild cramping and a small amount of vaginal bleeding for a few days after the procedure. This is normal.  Make sure you find out how to get your test results.   This information is not intended to replace advice given to you by your health care provider. Make sure you discuss any questions you have with your health care provider.   Document Released: 11/12/2004 Document Revised: 03/14/2013 Document Reviewed: 12/27/2012 Elsevier Interactive Patient Education [employer].

## 2015-10-10 LAB — CA 125: CA 125: 7 U/mL (ref <35)

## 2015-10-13 ENCOUNTER — Telehealth: Payer: Self-pay

## 2015-10-13 NOTE — Telephone Encounter (Signed)
Left message to call Marylon Verno at [phone]. 

## 2015-10-13 NOTE — Telephone Encounter (Signed)
-----   Message from Nunzio Cobbs, MD sent at 10/12/2015 12:33 PM EDT ----- Please inform patient that her CA125 test is negative and normal.  Patient is to return for an endometrial biopsy.  I have placed an order for this.   Cc- Marisa Sprinkles

## 2015-10-14 NOTE — Telephone Encounter (Signed)
Left message for patient to call Friant at [phone]. Left message for patient's daughter Amy, okay per ROI to call [place] at [phone].

## 2015-10-14 NOTE — Telephone Encounter (Signed)
Patient left message on answering machine returning call. She works during the day and it may be hard to reach her. Patient says it is ok to speak with her daughter Amy who is on her DPR.

## 2015-10-14 NOTE — Telephone Encounter (Signed)
Spoke with patient's daughter Amy, okay per ROI. Advised of results and message as seen below from Carmine. She is agreeable and will discuss results with her mother. Amy would like to discuss days and times for EMB with the patient and return call to schedule her EMB with Dr.Silva.

## 2015-10-21 NOTE — Telephone Encounter (Signed)
Left message to call Kimberly Walker at [phone]. 

## 2015-10-23 NOTE — Telephone Encounter (Signed)
Spoke with patient regarding scheduling of EMB. Patient declines to schedule at this time due to previous expense. She is asking about benefit coverage for her EMB. Advised this will be checked with our insurance department and she will be contacted directly regarding coverage. She is agreeable.  Cc: Lerry Liner

## 2015-10-23 NOTE — Telephone Encounter (Signed)
Thank you for the update. Will keep in work queue for endometrial biopsy.  Cc- Lerry Liner

## 2015-10-27 NOTE — Telephone Encounter (Signed)
Patient returned call. Reviewed benefits for endometrial biopsy. Patient understood, but had questions. Patient states ok to return call tomorrow after 4pm due to her work schedule or call her daughter Durene Fruits to relay information. Her daughter Durene Fruits may be reached at [phone]. Note to administrator regarding questions.

## 2015-10-27 NOTE — Telephone Encounter (Signed)
Called patient to review benefits for a recommended procedure. Left Voicemail requesting a call back. °

## 2015-10-29 NOTE — Telephone Encounter (Signed)
Spoke with patients daughter as requested by patient. Ms Minette Brine confirmed patients date of birth. Ms Minette Brine confirmed discussion during patients last appointment regarding recommended biopsy. Moved forward with discussion regarding benefits and plan. Verbalized understanding. States she will discuss with patient and call back this week to schedule. No further questions at this time. Leaving message open until scheduled. Benefits and plan in guarantor notes.

## 2015-11-19 ENCOUNTER — Telehealth: Payer: Self-pay | Admitting: Obstetrics and Gynecology

## 2015-11-19 NOTE — Telephone Encounter (Signed)
See account notes. Encounter closed

## 2015-11-19 NOTE — Telephone Encounter (Signed)
Called patient to review benefits for a recommended procedure. Left Voicemail requesting a call back. °

## 2015-11-21 NOTE — Telephone Encounter (Signed)
Call will not go through.  

## 2015-11-21 NOTE — Telephone Encounter (Signed)
Patient returned call. Spoke with patient regarding benefits. Patient is scheduled for ultrasound and endometrial biopsy on 11/27/15 with Dr Quincy Simmonds. Patient has advised she has not started menses, and questioned if she is having her menses next week, should she reschedule. Patient also wanted to mention, she is having problems with constipation and would like to address these concerns. Routing to [place] for review.

## 2015-11-25 NOTE — Telephone Encounter (Signed)
Follow-up call to patient. Left message to call back.  

## 2015-11-26 NOTE — Telephone Encounter (Signed)
Left message to call Kaitlyn at [phone]. 

## 2015-11-26 NOTE — Telephone Encounter (Signed)
Spoke with patient. Patient states that she started her menstrual cycle this morning. She is scheduled for a PUS and EMB tomorrow 11/27/2015. Advised this appointment will need to be rescheduled as we do not want her to be having her cycle at the time of her EMB. She is agreeable.Appointment for PUS and EMB rescheduled to 12/04/2015 at 10 am with 10:30 am consult with Dr.Silva. She is agreeable to date and time. Also spoke with patient regarding constipation concerns (see note below). Patient states she is not having regular bowel movements. Requesting advice. Denies taking any stool softeners at this time. Advised she may take OTC Colace 100 mg daily. She will need to ensure she is drinking plenty of water, exercising, and eating fiber rich foods. She is agreeable and verbalizes understanding. Will return call if she has any further questions, concerns, or constipation is not relieved.  Routing to provider for final review. Patient agreeable to disposition. Will close encounter.

## 2015-11-26 NOTE — Telephone Encounter (Signed)
Patient called to report she started her menstrual cycle this morning. She requests a call back to confirm whether she should keep her appointment for an ultrasound appointment tomorrow."

## 2015-11-27 ENCOUNTER — Other Ambulatory Visit: Payer: PRIVATE HEALTH INSURANCE

## 2015-11-27 ENCOUNTER — Other Ambulatory Visit: Payer: PRIVATE HEALTH INSURANCE | Admitting: Obstetrics and Gynecology

## 2015-12-04 ENCOUNTER — Ambulatory Visit (INDEPENDENT_AMBULATORY_CARE_PROVIDER_SITE_OTHER): Payer: PRIVATE HEALTH INSURANCE | Admitting: Obstetrics and Gynecology

## 2015-12-04 ENCOUNTER — Encounter: Payer: Self-pay | Admitting: Obstetrics and Gynecology

## 2015-12-04 ENCOUNTER — Ambulatory Visit (INDEPENDENT_AMBULATORY_CARE_PROVIDER_SITE_OTHER): Payer: PRIVATE HEALTH INSURANCE

## 2015-12-04 VITALS — BP 132/80 | HR 80 | Ht 64.0 in | Wt 163.0 lb

## 2015-12-04 DIAGNOSIS — N83201 Unspecified ovarian cyst, right side: Secondary | ICD-10-CM

## 2015-12-04 DIAGNOSIS — N939 Abnormal uterine and vaginal bleeding, unspecified: Secondary | ICD-10-CM

## 2015-12-04 DIAGNOSIS — D259 Leiomyoma of uterus, unspecified: Secondary | ICD-10-CM | POA: Diagnosis not present

## 2015-12-04 NOTE — Progress Notes (Signed)
Patient ID: Kimberly Walker, female   DOB: 12-May-1966, 50 y.o.   MRN: [id number]  GYNECOLOGY  VISIT   HPI: 50 y.o.   Married  female   [phone] with Patient's last menstrual period was 11/26/2015 (exact date).   here for follow up pelvic ultrasound visit to recheck solid right ovarian mass and have endometrial biopsy. Daughter, a Marine scientist, is present for the entire visit today.  Patient is asking:  "Can I just have a hysterectomy?"  Patient states she has been bleeding in between cycles and occasionally skipping a cycle.  Spotting in between menses. Bleeding usually lasts 5 - 6 days.  Heavy flow with pad plus tampon change every 2 - 3 hours. Has cramping and Advil works to treat this.   Pelvic ultrasound done on 10/09/15: Uterus - 2 fibroids, largest 3.4 cm encroaching on endometrium. EMS - 10.59 mm. Ovaries - right ovary with solid areas - 20 x 17 mm and 22 x 18 mm - echogenic, avascular, smooth; also 16 mm follicle. Left ovary normal.  Free fluid - no  CA125 - 7.  Hx of one Cesarean Section.   Works on an [employer].    GYNECOLOGIC HISTORY: Patient's last menstrual period was 11/26/2015 (exact date). Contraception:  Tubal ligation.           OB History    Gravida Para Term Preterm AB TAB SAB Ectopic Multiple Living   2 2 2       2          Patient Active Problem List   Diagnosis Date Noted  . Absolute anemia 09/08/2015  . Essential hypertension 07/12/2014  . Tension headache 07/12/2014    Past Medical History  Diagnosis Date  . Frequent headaches   . Hyperlipidemia   . Hypertension   . Seizure ([place])   . Migraine   . Abnormal uterine bleeding     Past Surgical History  Procedure Laterality Date  . Tubal ligation    . Cesarean section  1988    Current Outpatient Prescriptions  Medication Sig Dispense Refill  . ferrous sulfate 325 (65 FE) MG tablet Take 325 mg by mouth daily with breakfast.    . hydrochlorothiazide (MICROZIDE)  12.5 MG capsule Take 1 capsule (12.5 mg total) by mouth daily. 90 capsule 3   No current facility-administered medications for this visit.     ALLERGIES: Review of patient's allergies indicates no known allergies.  Family History  Problem Relation Age of Onset  . Diabetes Mother   . Hypertension Mother     Social History   Social History  . Marital Status: Married    Spouse Name: N/A  . Number of Children: N/A  . Years of Education: N/A   Occupational History  . Not on file.   Social History Main Topics  . Smoking status: Never Smoker   . Smokeless tobacco: Not on file  . Alcohol Use: No  . Drug Use: No  . Sexual Activity:    Partners: Male    Birth Control/ Protection: Surgical     Comment: Tubal   Other Topics Concern  . Not on file   Social History Narrative   Updated 08/2015   Work or School: production - boarding panty [employer]      Home Situation: Lives with husband - feels safe at home      Spiritual Beliefs: Christian      Lifestyle: no regular aerobic exercise, diet so so  ROS:  Pertinent items are noted in HPI.  PHYSICAL EXAMINATION:    BP 132/80 mmHg  Pulse 80  Ht 5\' 4"  (1.626 m)  Wt 163 lb (73.936 kg)  BMI 27.97 kg/m2  LMP 11/26/2015 (Exact Date)    General appearance: alert, cooperative and appears stated age    Pelvic: External genitalia:  no lesions              Urethra:  normal appearing urethra with no masses, tenderness or lesions              Bartholins and Skenes: normal                 Vagina: normal appearing vagina with normal color and discharge, no lesions              Cervix: no lesions               Bimanual Exam:  Uterus:  enlarged, 14 weeks size., globular.  Exam difficult Walker to patient guarding.               Adnexa: normal adnexa and no mass, fullness, tenderness.  Difficulty to feel separately from the uterus.             Procedure - endometrial biopsy Consent performed. Speculum place in vagina.  Sterile  prep of cervix with  Hibiclens Tenaculum to anterior cervical lip. Paracervical block with 10 cc 1% lidocaine - yes.  Lot number [id number], expiration 09/23/16. Pipelle placed to     7     cm without difficulty once. Tissue obtained and sent to pathology. Speculum removed.  No complications. Minimal EBL.  Chaperone was present for exam.  Ultrasound today Multifibroid uterus. Largest 37 mm.  No significant change in size.  EMS 7.33 mm. Submucosal fibroid 37 mm. Ovaries:  Left ovary normal with small amount of free fluid anterior and medial to ovary.  Right ovary with cyst 31 x 26 x 34 mm.  Previously measured as 20 x 17 x 22 mm.  Area is cystic with internal debris consistent with dermoid vs. Other etiology. Avascular.   ASSESSMENT  Irregular menstrual cycles.  Uterine fibroids - one partially submucous. Uterus enlarged to 14 week size. Right ovarian cyst with solid component. 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 Hx BTL.  Hx Cesarean Section.   PLAN  Follow up EMB.  Discussion of robotic total laparoscopic hysterectomy with bilateral salpingo-oophorectomy. Will also need cystoscopy and collection of pelvic washings. I discussed robotic hysterectomy with bilateral salpingo-oophorectomy. May need vaginal morcellation versus intraperitoneal morcellation in appropriate endoscopy bag.  I reviewed risks, benefits, and alternatives.  Risks include but are not limited to bleeding, infection, damage to surrounding organs, reaction to anesthesia, DVT, PE, death, need for reoperation, hernia formation, and menopausal symptoms if ovaries are removed.  Menopausal symptoms can be treated with hormonal medication.   Patient wishes to proceed in this direction.  Will precert procedure.  Discussed surgical expectations and 6 weeks recovery.  An After Visit Summary was printed and given to the patient.  __25____ minutes face to face time of which over 50% was spent in counseling.

## 2015-12-04 NOTE — Patient Instructions (Signed)

## 2015-12-08 LAB — IPS OTHER TISSUE BIOPSY

## 2015-12-09 ENCOUNTER — Telehealth: Payer: Self-pay | Admitting: Emergency Medicine

## 2015-12-09 NOTE — Telephone Encounter (Signed)
-----   Message from Nunzio Cobbs, MD sent at 12/08/2015  8:30 PM EDT ----- Please inform patient of normal endometrial biopsy.  OK to proceed with robotic hysterectomy as previously planned.   Cc - Lamont Snowball, Marisa Sprinkles

## 2015-12-09 NOTE — Telephone Encounter (Signed)
Message left to return call to Kimberly Walker at [phone] or triage nurse at [phone]

## 2015-12-09 NOTE — Telephone Encounter (Signed)
Incoming call from patient's daughter, she is listed on designated party release form for release of information. She states her Mother requested that she call for results.  Advised of normal results and message from Dr. Quincy Simmonds.  Will send patient information to Lamont Snowball, RN and advised that patient will be contacted with surgical benefits information.  She will relay this information to the patient.  Routing to provider for final review. Patient agreeable to disposition. Will close encounter.  Cc Lamont Snowball, RN, Lerry Liner for surgical planning.

## 2015-12-11 ENCOUNTER — Telehealth: Payer: Self-pay | Admitting: Obstetrics and Gynecology

## 2015-12-11 NOTE — Telephone Encounter (Signed)
Called patient to review benefits for a recommended procedure. Left Voicemail requesting a call back. °

## 2015-12-15 NOTE — Telephone Encounter (Signed)
Spoke with patient's daughter, Gypsy Balsam (she is on [place]) provided the professional benefits for recommended surgical procedure.  Ms Gerilyn Nestle will convey information to the patient and then will contact our office to review scheduling options.

## 2015-12-17 ENCOUNTER — Telehealth: Payer: Self-pay | Admitting: *Deleted

## 2015-12-17 NOTE — Telephone Encounter (Addendum)
Call to patient's daughter, Amy. Patient's daughter called today to make arrangements for patient's surgery. Originally offered date of 01-13-16 is no longer available. Call to patient's daughter and left message that this date is no longer available and left possible options of 6-12, 6-26 or 02-10-16. Left message to call back tomorrow.   Patient's daughter Warren Lacy is on [place].

## 2015-12-18 NOTE — Telephone Encounter (Signed)
Return call to patient's daughter, Amy. Discussed available surgery dates. Amy will check with her mom and call back to confirm. Wants to hold July 18 date for sure but will check and see if mom can do 6-12 or 6-26 and call back tomorrow.

## 2015-12-18 NOTE — Telephone Encounter (Signed)
Patient returning call to schedule surgery. Ok to call daughter Amy (dpr on file) because patient is at work. [phone].

## 2015-12-19 ENCOUNTER — Telehealth: Payer: Self-pay | Admitting: Obstetrics and Gynecology

## 2015-12-19 NOTE — Telephone Encounter (Signed)
Patient called to check on the status of the request. She is aware Gay Filler does not yet have an answer for her and that Gay Filler will call her next week. FYI only.

## 2015-12-19 NOTE — Telephone Encounter (Signed)
Call from patient. States she would like to have surgery on 01-19-16 if available.  Advised will call her back once date finalized.

## 2015-12-19 NOTE — Telephone Encounter (Signed)
Previous telephone note closed in error. Copy of original recent messages below.  Patient called to check on the status of the request. She is aware Gay Filler does not yet have an answer for her and that Gay Filler will call her next week. FYI only.            Huey Romans, RN at 12/19/2015 10:04 AM     Status: Signed       Expand All Collapse All   Call from patient. States she would like to have surgery on 01-19-16 if available.  Advised will call her back once date finalized.

## 2015-12-25 NOTE — Telephone Encounter (Signed)
Call to patient. Left message to call back. No emergency. Calling to provide update. At this point, June 26, 0000000 is still uncertain. (see previous encounter) Have scheduled surgery for 02-10-16 as next available date and will continue to work to move up date as available. Left message to call back.

## 2015-12-26 NOTE — Telephone Encounter (Signed)
Return call to Sally. °

## 2016-01-02 DIAGNOSIS — Z0289 Encounter for other administrative examinations: Secondary | ICD-10-CM

## 2016-01-05 NOTE — Telephone Encounter (Signed)
Call to patient, left message with update: No change in date at this time. Need to schedule appointments based on 02-10-16. Left message to call back.

## 2016-01-06 ENCOUNTER — Telehealth: Payer: Self-pay | Admitting: *Deleted

## 2016-01-06 NOTE — Telephone Encounter (Signed)
Returning a call to [place] via answering machine.

## 2016-01-06 NOTE — Telephone Encounter (Signed)
See next phone encounter for surgery information.  Routing to provider for final review. Patient agreeable to disposition. Will close encounter.

## 2016-01-06 NOTE — Telephone Encounter (Signed)
Call to patient to review surgery instructions. Surgery is scheduled for 02-10-16 at [facility]. Needs office surgery consult and instructions. Left message to call back.

## 2016-01-09 NOTE — Telephone Encounter (Signed)
Patient is returning a call to Sally. °

## 2016-01-09 NOTE — Telephone Encounter (Signed)
Call to patient. Advised of surgery date of 02-10-16. Still unable to get date moved up at this time but will continue to try.  Patient states she no longer desires to move date up. Due to need to schedule time off work for herself and her daughter, they are satisfied with 02-10-16 date.  Surgery consult appointment scheduled. Will review surgery instruction sheet at consult appointment with daughter.   Routing to provider for final review. Patient agreeable to disposition. Will close encounter.

## 2016-01-23 ENCOUNTER — Ambulatory Visit (INDEPENDENT_AMBULATORY_CARE_PROVIDER_SITE_OTHER): Payer: PRIVATE HEALTH INSURANCE | Admitting: Obstetrics and Gynecology

## 2016-01-23 ENCOUNTER — Encounter: Payer: Self-pay | Admitting: Obstetrics and Gynecology

## 2016-01-23 VITALS — BP 160/90 | HR 70 | Ht 64.0 in | Wt 165.4 lb

## 2016-01-23 DIAGNOSIS — D259 Leiomyoma of uterus, unspecified: Secondary | ICD-10-CM

## 2016-01-23 DIAGNOSIS — D500 Iron deficiency anemia secondary to blood loss (chronic): Secondary | ICD-10-CM

## 2016-01-23 DIAGNOSIS — N83201 Unspecified ovarian cyst, right side: Secondary | ICD-10-CM

## 2016-01-23 NOTE — Progress Notes (Signed)
Patient ID: Kimberly Walker, female   DOB: [birth year], 50 y.o.   MRN: [id number] GYNECOLOGY  VISIT   HPI: 50 y.o.   Married  [place] American  female   [phone] with Patient's last menstrual period was 01/09/2016 (exact date).   here for surgical consult.    Her daughter is present for her entire visit today.  Has uterine fibroids and heavy bleeding. Right ovary with possible dermoid cyst.   U/S 12/04/15: Multifibroid uterus. Largest 37 mm. No significant change in size.  EMS 7.33 mm. Submucosal fibroid 37 mm. Ovaries: Left ovary normal with small amount of free fluid anterior and medial to ovary.  Right ovary with cyst 31 x 26 x 34 mm. Previously measured as 20 x 17 x 22 mm. Area is cystic with internal debris consistent with dermoid vs. Other etiology. Avascular.  EMB 12/04/15:  Benign proliferative endometrium.   CA125 10/09/15: 7.  She has had a tubal ligation and declines future childbearing.   Never took medication for seizure.   GYNECOLOGIC HISTORY: Patient's last menstrual period was 01/09/2016 (exact date). Contraception:  Tubal Menopausal hormone therapy:  none Last mammogram:  06-13-15 Density Cat.D/Neg/BiRads1:[facility] Last pap smear:   02-22-14 Neg        OB History    Gravida Para Term Preterm AB TAB SAB Ectopic Multiple Living   2 2 2       2          Patient Active Problem List   Diagnosis Date Noted  . Absolute anemia 09/08/2015  . Essential hypertension 07/12/2014  . Tension headache 07/12/2014    Past Medical History  Diagnosis Date  . Frequent headaches   . Hyperlipidemia   . Hypertension   . Seizure ([place])   . Migraine   . Abnormal uterine bleeding     Past Surgical History  Procedure Laterality Date  . Tubal ligation    . Cesarean section  1988    Current Outpatient Prescriptions  Medication Sig Dispense Refill  . ferrous sulfate 325 (65 FE) MG tablet Take 325 mg by mouth daily with breakfast.    . hydrochlorothiazide  (MICROZIDE) 12.5 MG capsule Take 1 capsule (12.5 mg total) by mouth daily. 90 capsule 3   No current facility-administered medications for this visit.     ALLERGIES: Review of patient's allergies indicates no known allergies.  Family History  Problem Relation Age of Onset  . Diabetes Mother   . Hypertension Mother     Social History   Social History  . Marital Status: Married    Spouse Name: N/A  . Number of Children: N/A  . Years of Education: N/A   Occupational History  . Not on file.   Social History Main Topics  . Smoking status: Never Smoker   . Smokeless tobacco: Not on file  . Alcohol Use: No  . Drug Use: No  . Sexual Activity:    Partners: Male    Birth Control/ Protection: Surgical     Comment: Tubal   Other Topics Concern  . Not on file   Social History Narrative   Updated 08/2015   Work or School: production - boarding panty hose      Home Situation: Lives with husband - feels safe at home      Spiritual Beliefs: Christian      Lifestyle: no regular aerobic exercise, diet so so          ROS:  Pertinent items are noted in  HPI.  PHYSICAL EXAMINATION:    BP 160/90 mmHg  Pulse 70  Ht 5\' 4"  (1.626 m)  Wt 165 lb 6.4 oz (75.025 kg)  BMI 28.38 kg/m2  LMP 01/09/2016 (Exact Date)    General appearance: alert, cooperative and appears stated age Head: Normocephalic, without obvious abnormality, atraumatic Neck: no adenopathy, supple, symmetrical, trachea midline and thyroid normal to inspection and palpation Lungs: clear to auscultation bilaterally Heart: regular rate and rhythm Abdomen:   Pfannenstiel incision, soft, non-tender, 14 week size pelvic mass.  Extremities: extremities normal, atraumatic, no cyanosis or edema Skin: Skin color, texture, turgor normal. No rashes or lesions Lymph nodes: Cervical, supraclavicular, and axillary nodes normal. No abnormal inguinal nodes palpated Neurologic: Grossly normal  Pelvic: External genitalia:  no  lesions              Urethra:  normal appearing urethra with no masses, tenderness or lesions              Bartholins and Skenes: normal                 Vagina: normal appearing vagina with normal color and discharge, no lesions              Cervix: no lesions               Bimanual Exam:  Uterus:  enlarged,  14 weeks size              Adnexa: no mass, fullness, tenderness           Chaperone was present for exam.  ASSESSMENT  Irregular menstrual cycles.  Uterine fibroids - one partially submucous. Uterus enlarged to 14 week size. Right ovarian cyst with solid component. 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 Hx BTL.  Hx Cesarean Section.    PLAN  Proceed with robotic total laparoscopic hysterectomy with bilateral salpingo-oophorectomy. Will also need cystoscopy and collection of pelvic washings. Risks, benefits, and alternatives discussed with the patient who wishes to proceed.  She understands there is risk of laparotomy for completion of the procedure. She understands that removal of her bilateral ovaries will result in surgical menopause.  Tx options for symptoms afterward can include estrogen therapy, testosterone supplementation, vaginal local estrogen, and SSRIs/SNRIs. Surgical recovery and expectations reviewed.  She indicates understanding.   An After Visit Summary was printed and given to the patient.  _25_____ minutes face to face time of which over 50% was spent in counseling.

## 2016-01-23 NOTE — Progress Notes (Signed)
Following patient's appointment with Dr Quincy Simmonds, surgical instruction sheet reviewed with patient and daughter. Printed copy provided and questions answered. Encouraged to call back if any additional questions.

## 2016-01-28 LAB — HEMOGLOBIN, FINGERSTICK: HEMOGLOBIN, FINGERSTICK: 11.6 g/dL — AB (ref 12.0–16.0)

## 2016-01-29 ENCOUNTER — Encounter (HOSPITAL_COMMUNITY): Payer: Self-pay

## 2016-01-29 ENCOUNTER — Encounter (HOSPITAL_COMMUNITY)
Admission: RE | Admit: 2016-01-29 | Discharge: 2016-01-29 | Disposition: A | Discharge status: Home / Self Care | Payer: PRIVATE HEALTH INSURANCE | Source: Ambulatory Visit | Attending: Obstetrics and Gynecology | Admitting: Obstetrics and Gynecology

## 2016-01-29 DIAGNOSIS — N938 Other specified abnormal uterine and vaginal bleeding: Secondary | ICD-10-CM | POA: Diagnosis not present

## 2016-01-29 DIAGNOSIS — D259 Leiomyoma of uterus, unspecified: Secondary | ICD-10-CM | POA: Insufficient documentation

## 2016-01-29 DIAGNOSIS — Z01810 Encounter for preprocedural cardiovascular examination: Secondary | ICD-10-CM | POA: Insufficient documentation

## 2016-01-29 DIAGNOSIS — Z01812 Encounter for preprocedural laboratory examination: Secondary | ICD-10-CM | POA: Insufficient documentation

## 2016-01-29 HISTORY — DX: Anemia, unspecified: D64.9

## 2016-01-29 LAB — CBC
HEMATOCRIT: 32.6 % — AB (ref 36.0–46.0)
HEMOGLOBIN: 10.7 g/dL — AB (ref 12.0–15.0)
MCH: 30.4 pg (ref 26.0–34.0)
MCHC: 32.8 g/dL (ref 30.0–36.0)
MCV: 92.6 fL (ref 78.0–100.0)
Platelets: 309 10*3/uL (ref 150–400)
RBC: 3.52 MIL/uL — ABNORMAL LOW (ref 3.87–5.11)
RDW: 13.2 % (ref 11.5–15.5)
WBC: 7.6 10*3/uL (ref 4.0–10.5)

## 2016-01-29 LAB — BASIC METABOLIC PANEL
Anion gap: 6 (ref 5–15)
BUN: 24 mg/dL — ABNORMAL HIGH (ref 6–20)
CO2: 27 mmol/L (ref 22–32)
Calcium: 9.1 mg/dL (ref 8.9–10.3)
Chloride: 102 mmol/L (ref 101–111)
Creatinine, Ser: 1.06 mg/dL — ABNORMAL HIGH (ref 0.44–1.00)
GFR, EST NON AFRICAN AMERICAN: 60 mL/min — AB (ref >60)
Glucose, Bld: 97 mg/dL (ref 65–99)
POTASSIUM: 3.6 mmol/L (ref 3.5–5.1)
SODIUM: 135 mmol/L (ref 135–145)

## 2016-01-29 NOTE — Patient Instructions (Signed)
Your procedure is scheduled on:  Tuesday, February 10, 2016  Enter through the Main Entrance of [facility] at:  6:00 AM  Pick up the phone at the desk and dial [phone].  Call this number if you have problems the morning of surgery: [phone].  Remember:  Do NOT eat food or drink after:  Midnight Monday, February 09, 2016  Take these medicines the morning of surgery with a SIP OF WATER:  Hydrochlorothiazide  Do NOT wear jewelry (body piercing), metal hair clips/bobby pins, make-up, or nail polish. Do NOT wear lotions, powders, or perfumes.  You may wear deodorant. Do NOT shave for 48 hours prior to surgery. Do NOT bring valuables to the hospital. Contacts, dentures, or bridgework may not be worn into surgery.  Leave suitcase in car.  After surgery it may be brought to your room.  For patients admitted to the hospital, checkout time is 11:00 AM the day of discharge.

## 2016-01-30 ENCOUNTER — Other Ambulatory Visit (HOSPITAL_COMMUNITY): Payer: PRIVATE HEALTH INSURANCE

## 2016-02-02 ENCOUNTER — Telehealth: Payer: Self-pay | Admitting: Emergency Medicine

## 2016-02-02 NOTE — Telephone Encounter (Signed)
Patient notified.  Verbalized understanding. 

## 2016-02-02 NOTE — Telephone Encounter (Signed)
-----   Message from Nunzio Cobbs, MD sent at 01/29/2016  2:47 PM EDT ----- Please contact patient regarding her preop labs.  Her Hgb is 10.7. She needs to increase her iron to iron sulfate 325 mg po tid with meals.

## 2016-02-02 NOTE — Telephone Encounter (Signed)
Message left to return call to Malik Ruffino at [phone].    

## 2016-02-09 MED ORDER — CEFOTETAN DISODIUM 2 G IJ SOLR
2.0000 g | INTRAMUSCULAR | Status: AC
  Administered 2016-02-10: 2 g via INTRAVENOUS
  Filled 2016-02-09: qty 2

## 2016-02-09 NOTE — H&P (Signed)
Progress Notes      Nunzio Cobbs, MD at 01/23/2016 9:31 AM     Status: Signed       Expand All Collapse All   Patient ID: Kimberly Walker, female DOB: [birth year], 50 y.o. MRN: [id number] GYNECOLOGY VISIT  HPI: 50 y.o. Married [place] American female  [phone] with Patient's last menstrual period was 01/09/2016 (exact date).  here for surgical consult.   Her daughter is present for her entire visit today.  Has uterine fibroids and heavy bleeding. Right ovary with possible dermoid cyst.   U/S 12/04/15: Multifibroid uterus. Largest 37 mm. No significant change in size.  EMS 7.33 mm. Submucosal fibroid 37 mm. Ovaries: Left ovary normal with small amount of free fluid anterior and medial to ovary.  Right ovary with cyst 31 x 26 x 34 mm. Previously measured as 20 x 17 x 22 mm. Area is cystic with internal debris consistent with dermoid vs. Other etiology. Avascular.  EMB 12/04/15: Benign proliferative endometrium.   CA125 10/09/15: 7.  She has had a tubal ligation and declines future childbearing.   Never took medication for seizure.   GYNECOLOGIC HISTORY: Patient's last menstrual period was 01/09/2016 (exact date). Contraception: Tubal Menopausal hormone therapy: none Last mammogram: 06-13-15 [facility]D/Neg/[facility] Last pap smear: 02-22-14 Neg   OB History    Gravida Para Term Preterm AB TAB SAB Ectopic Multiple Living   2 2 2       2        Patient Active Problem List   Diagnosis Date Noted  . Absolute anemia 09/08/2015  . Essential hypertension 07/12/2014  . Tension headache 07/12/2014    Past Medical History  Diagnosis Date  . Frequent headaches   . Hyperlipidemia   . Hypertension   . Seizure ([place])   . Migraine   . Abnormal uterine bleeding     Past Surgical History  Procedure Laterality Date  . Tubal ligation    .  Cesarean section  1988    Current Outpatient Prescriptions  Medication Sig Dispense Refill  . ferrous sulfate 325 (65 FE) MG tablet Take 325 mg by mouth daily with breakfast.    . hydrochlorothiazide (MICROZIDE) 12.5 MG capsule Take 1 capsule (12.5 mg total) by mouth daily. 90 capsule 3   No current facility-administered medications for this visit.     ALLERGIES: Review of patient's allergies indicates no known allergies.  Family History  Problem Relation Age of Onset  . Diabetes Mother   . Hypertension Mother     Social History   Social History  . Marital Status: Married    Spouse Name: N/A  . Number of Children: N/A  . Years of Education: N/A   Occupational History  . Not on file.   Social History Main Topics  . Smoking status: Never Smoker   . Smokeless tobacco: Not on file  . Alcohol Use: No  . Drug Use: No  . Sexual Activity:    Partners: Male    Birth Control/ Protection: Surgical     Comment: Tubal   Other Topics Concern  . Not on file   Social History Narrative   Updated 08/2015   Work or School: production - boarding [employer]      Home Situation: Lives with husband - feels safe at home      Spiritual Beliefs: Christian      Lifestyle: no regular aerobic exercise, diet so so  ROS: Pertinent items are noted in HPI.  PHYSICAL EXAMINATION:   BP 160/90 mmHg  Pulse 70  Ht 5\' 4"  (1.626 m)  Wt 165 lb 6.4 oz (75.025 kg)  BMI 28.38 kg/m2  LMP 01/09/2016 (Exact Date)  General appearance: alert, cooperative and appears stated age Head: Normocephalic, without obvious abnormality, atraumatic Neck: no adenopathy, supple, symmetrical, trachea midline and thyroid normal to inspection and palpation Lungs: clear to auscultation bilaterally Heart: regular rate and rhythm Abdomen: Pfannenstiel incision, soft, non-tender, 14  week size pelvic mass.  Extremities: extremities normal, atraumatic, no cyanosis or edema Skin: Skin color, texture, turgor normal. No rashes or lesions Lymph nodes: Cervical, supraclavicular, and axillary nodes normal. No abnormal inguinal nodes palpated Neurologic: Grossly normal  Pelvic: External genitalia: no lesions  Urethra: normal appearing urethra with no masses, tenderness or lesions  Bartholins and Skenes: normal   Vagina: normal appearing vagina with normal color and discharge, no lesions  Cervix: no lesions   Bimanual Exam: Uterus: enlarged,  14 weeks size  Adnexa: no mass, fullness, tenderness    Chaperone was present for exam.  ASSESSMENT  Irregular menstrual cycles.  Uterine fibroids - one partially submucous. Uterus enlarged to 14 week size. Right ovarian cyst with solid component. 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 Hx BTL.  Hx Cesarean Section.   PLAN  Proceed with robotic total laparoscopic hysterectomy with bilateral salpingo-oophorectomy. Will also need cystoscopy and collection of pelvic washings. Risks, benefits, and alternatives discussed with the patient who wishes to proceed.  She understands there is risk of laparotomy for completion of the procedure. She understands that removal of her bilateral ovaries will result in surgical menopause. Tx options for symptoms afterward can include estrogen therapy, testosterone supplementation, vaginal local estrogen, and SSRIs/SNRIs. Surgical recovery and expectations reviewed.  She indicates understanding.  An After Visit Summary was printed and given to the patient.  _25_____ minutes face to face time of which over 50% was spent in counseling.            Revision History       Date/Time User Action    > 01/23/2016 7:37 PM Nunzio Cobbs, MD Sign     01/23/2016 9:43 AM  Lowella Fairy, CMA Sign at close encounter              Huey Romans, RN at 01/23/2016 12:09 PM     Status: Signed       Expand All Collapse All   Following patient's appointment with Dr Quincy Simmonds, surgical instruction sheet reviewed with patient and daughter. Printed copy provided and questions answered. Encouraged to call back if any additional questions.

## 2016-02-09 NOTE — Anesthesia Preprocedure Evaluation (Addendum)
Anesthesia Evaluation  Patient identified by MRN, date of birth, ID band Patient awake    Reviewed: Allergy & Precautions, H&P , NPO status , Patient's Chart, lab work & pertinent test results  History of Anesthesia Complications Negative for: history of anesthetic complications  Airway Mallampati: II  TM Distance: >3 FB Neck ROM: full    Dental  (+) Edentulous Lower, Edentulous Upper   Pulmonary neg pulmonary ROS,    Pulmonary exam normal breath sounds clear to auscultation       Cardiovascular hypertension, Normal cardiovascular exam Rhythm:regular Rate:Normal     Neuro/Psych  Headaches, Seizures -,     GI/Hepatic negative GI ROS, Neg liver ROS,   Endo/Other  negative endocrine ROS  Renal/GU negative Renal ROS     Musculoskeletal   Abdominal   Peds  Hematology  (+) anemia ,   Anesthesia Other Findings Seizures with migraines  Reproductive/Obstetrics negative OB ROS                           Anesthesia Physical Anesthesia Plan  ASA: II  Anesthesia Plan: General   Post-op Pain Management:    Induction: Intravenous  Airway Management Planned: Oral ETT  Additional Equipment:   Intra-op Plan:   Post-operative Plan:   Informed Consent: I have reviewed the patients History and Physical, chart, labs and discussed the procedure including the risks, benefits and alternatives for the proposed anesthesia with the patient or authorized representative who has indicated his/her understanding and acceptance.   Dental Advisory Given  Plan Discussed with: Anesthesiologist, CRNA and Surgeon  Anesthesia Plan Comments:        Anesthesia Quick Evaluation

## 2016-02-10 ENCOUNTER — Observation Stay (HOSPITAL_COMMUNITY)
Admission: RE | Admit: 2016-02-10 | Discharge: 2016-02-11 | Disposition: A | Discharge status: Home / Self Care | Payer: No Typology Code available for payment source | Source: Ambulatory Visit | Attending: Obstetrics and Gynecology | Admitting: Obstetrics and Gynecology

## 2016-02-10 ENCOUNTER — Ambulatory Visit (HOSPITAL_COMMUNITY): Payer: No Typology Code available for payment source | Admitting: Anesthesiology

## 2016-02-10 ENCOUNTER — Encounter (HOSPITAL_COMMUNITY)
Admission: RE | Disposition: A | Discharge status: Home / Self Care | Payer: Self-pay | Source: Ambulatory Visit | Attending: Obstetrics and Gynecology

## 2016-02-10 ENCOUNTER — Encounter (HOSPITAL_COMMUNITY): Payer: Self-pay

## 2016-02-10 DIAGNOSIS — Z90710 Acquired absence of both cervix and uterus: Secondary | ICD-10-CM | POA: Diagnosis present

## 2016-02-10 DIAGNOSIS — N920 Excessive and frequent menstruation with regular cycle: Secondary | ICD-10-CM | POA: Insufficient documentation

## 2016-02-10 DIAGNOSIS — N926 Irregular menstruation, unspecified: Secondary | ICD-10-CM | POA: Diagnosis not present

## 2016-02-10 DIAGNOSIS — D261 Other benign neoplasm of corpus uteri: Secondary | ICD-10-CM | POA: Diagnosis not present

## 2016-02-10 DIAGNOSIS — N83201 Unspecified ovarian cyst, right side: Secondary | ICD-10-CM | POA: Diagnosis not present

## 2016-02-10 DIAGNOSIS — D259 Leiomyoma of uterus, unspecified: Secondary | ICD-10-CM | POA: Diagnosis not present

## 2016-02-10 DIAGNOSIS — N921 Excessive and frequent menstruation with irregular cycle: Secondary | ICD-10-CM | POA: Diagnosis not present

## 2016-02-10 DIAGNOSIS — E785 Hyperlipidemia, unspecified: Secondary | ICD-10-CM | POA: Insufficient documentation

## 2016-02-10 DIAGNOSIS — I10 Essential (primary) hypertension: Secondary | ICD-10-CM | POA: Insufficient documentation

## 2016-02-10 DIAGNOSIS — D270 Benign neoplasm of right ovary: Principal | ICD-10-CM | POA: Insufficient documentation

## 2016-02-10 DIAGNOSIS — N7011 Chronic salpingitis: Secondary | ICD-10-CM | POA: Insufficient documentation

## 2016-02-10 DIAGNOSIS — N939 Abnormal uterine and vaginal bleeding, unspecified: Secondary | ICD-10-CM | POA: Diagnosis not present

## 2016-02-10 DIAGNOSIS — N72 Inflammatory disease of cervix uteri: Secondary | ICD-10-CM | POA: Diagnosis not present

## 2016-02-10 HISTORY — PX: CYSTOSCOPY: SHX5120

## 2016-02-10 HISTORY — PX: ROBOTIC ASSISTED TOTAL HYSTERECTOMY WITH BILATERAL SALPINGO OOPHERECTOMY: SHX6086

## 2016-02-10 LAB — TYPE AND SCREEN
ABO/RH(D): A POS
ANTIBODY SCREEN: NEGATIVE

## 2016-02-10 LAB — ABO/RH: ABO/RH(D): A POS

## 2016-02-10 LAB — PREGNANCY, URINE: Preg Test, Ur: NEGATIVE

## 2016-02-10 SURGERY — ROBOTIC ASSISTED TOTAL HYSTERECTOMY WITH BILATERAL SALPINGO OOPHORECTOMY
Anesthesia: General | Site: Urethra

## 2016-02-10 MED ORDER — ARTIFICIAL TEARS OP OINT
TOPICAL_OINTMENT | OPHTHALMIC | Status: DC | PRN
  Administered 2016-02-10: 1 via OPHTHALMIC

## 2016-02-10 MED ORDER — OXYCODONE HCL 5 MG/5ML PO SOLN: 5.0000 mg | Freq: Once | ORAL | Status: DC | PRN

## 2016-02-10 MED ORDER — ONDANSETRON HCL 4 MG/2ML IJ SOLN
INTRAMUSCULAR | Status: AC
  Filled 2016-02-10: qty 2

## 2016-02-10 MED ORDER — PHENYLEPHRINE HCL 10 MG/ML IJ SOLN
INTRAMUSCULAR | Status: DC | PRN
  Administered 2016-02-10: 80 ug via INTRAVENOUS
  Administered 2016-02-10 (×3): 40 ug via INTRAVENOUS

## 2016-02-10 MED ORDER — ONDANSETRON HCL 4 MG/2ML IJ SOLN
4.0000 mg | Freq: Four times a day (QID) | INTRAMUSCULAR | Status: DC | PRN
  Administered 2016-02-10 (×2): 4 mg via INTRAVENOUS
  Filled 2016-02-10 (×2): qty 2

## 2016-02-10 MED ORDER — PROPOFOL 10 MG/ML IV BOLUS
INTRAVENOUS | Status: DC | PRN
  Administered 2016-02-10: 150 mg via INTRAVENOUS

## 2016-02-10 MED ORDER — ROCURONIUM BROMIDE 100 MG/10ML IV SOLN
INTRAVENOUS | Status: AC
Start: 2016-02-10 — End: 2016-02-10
  Filled 2016-02-10: qty 1

## 2016-02-10 MED ORDER — OXYCODONE-ACETAMINOPHEN 5-325 MG PO TABS
1.0000 | ORAL_TABLET | ORAL | Status: DC | PRN
Start: 2016-02-10 — End: 2016-02-11

## 2016-02-10 MED ORDER — MORPHINE SULFATE (PF) 4 MG/ML IV SOLN
2.0000 mg | INTRAVENOUS | Status: DC | PRN
  Administered 2016-02-10 (×2): 2 mg via INTRAVENOUS
  Filled 2016-02-10 (×2): qty 1

## 2016-02-10 MED ORDER — ONDANSETRON HCL 4 MG PO TABS: 4.0000 mg | ORAL_TABLET | Freq: Four times a day (QID) | ORAL | Status: DC | PRN

## 2016-02-10 MED ORDER — PROMETHAZINE HCL 25 MG/ML IJ SOLN: 6.2500 mg | INTRAMUSCULAR | Status: DC | PRN

## 2016-02-10 MED ORDER — LACTATED RINGERS IV SOLN
INTRAVENOUS | Status: DC
  Administered 2016-02-10 (×2): via INTRAVENOUS

## 2016-02-10 MED ORDER — OXYCODONE HCL 5 MG PO TABS: 5.0000 mg | ORAL_TABLET | Freq: Once | ORAL | Status: DC | PRN

## 2016-02-10 MED ORDER — SODIUM CHLORIDE 0.9 % IJ SOLN
INTRAMUSCULAR | Status: AC
  Filled 2016-02-10: qty 50

## 2016-02-10 MED ORDER — FENTANYL CITRATE (PF) 250 MCG/5ML IJ SOLN
INTRAMUSCULAR | Status: AC
  Filled 2016-02-10: qty 5

## 2016-02-10 MED ORDER — LACTATED RINGERS IV SOLN
INTRAVENOUS | Status: DC
  Administered 2016-02-10 (×3): via INTRAVENOUS

## 2016-02-10 MED ORDER — KETOROLAC TROMETHAMINE 15 MG/ML IJ SOLN
15.0000 mg | Freq: Four times a day (QID) | INTRAMUSCULAR | Status: DC
  Administered 2016-02-10 – 2016-02-11 (×3): 15 mg via INTRAVENOUS
  Filled 2016-02-10 (×4): qty 1

## 2016-02-10 MED ORDER — STERILE WATER FOR IRRIGATION IR SOLN
Status: DC | PRN
  Administered 2016-02-10: 1000 mL

## 2016-02-10 MED ORDER — SUGAMMADEX SODIUM 200 MG/2ML IV SOLN
INTRAVENOUS | Status: DC | PRN
  Administered 2016-02-10: 150 mg via INTRAVENOUS

## 2016-02-10 MED ORDER — PROMETHAZINE HCL 25 MG/ML IJ SOLN
12.5000 mg | Freq: Four times a day (QID) | INTRAMUSCULAR | Status: DC | PRN
  Administered 2016-02-10: 12.5 mg via INTRAVENOUS
  Filled 2016-02-10: qty 1

## 2016-02-10 MED ORDER — ONDANSETRON HCL 4 MG/2ML IJ SOLN
INTRAMUSCULAR | Status: DC | PRN
  Administered 2016-02-10: 4 mg via INTRAVENOUS

## 2016-02-10 MED ORDER — SUGAMMADEX SODIUM 200 MG/2ML IV SOLN: INTRAVENOUS | Status: DC | PRN

## 2016-02-10 MED ORDER — EPHEDRINE SULFATE 50 MG/ML IJ SOLN
INTRAMUSCULAR | Status: DC | PRN
  Administered 2016-02-10: 5 mg via INTRAVENOUS
  Administered 2016-02-10: 10 mg via INTRAVENOUS
  Administered 2016-02-10: 5 mg via INTRAVENOUS

## 2016-02-10 MED ORDER — BUPIVACAINE HCL (PF) 0.25 % IJ SOLN
INTRAMUSCULAR | Status: AC
  Filled 2016-02-10: qty 30

## 2016-02-10 MED ORDER — IBUPROFEN 600 MG PO TABS: 600.0000 mg | ORAL_TABLET | Freq: Four times a day (QID) | ORAL | Status: DC | PRN

## 2016-02-10 MED ORDER — BUPIVACAINE HCL (PF) 0.25 % IJ SOLN
INTRAMUSCULAR | Status: DC | PRN
  Administered 2016-02-10: 6 mL

## 2016-02-10 MED ORDER — FENTANYL CITRATE (PF) 100 MCG/2ML IJ SOLN
INTRAMUSCULAR | Status: DC | PRN
  Administered 2016-02-10 (×3): 50 ug via INTRAVENOUS

## 2016-02-10 MED ORDER — MENTHOL 3 MG MT LOZG: 1.0000 | LOZENGE | OROMUCOSAL | Status: DC | PRN

## 2016-02-10 MED ORDER — SCOPOLAMINE 1 MG/3DAYS TD PT72
1.0000 | MEDICATED_PATCH | Freq: Once | TRANSDERMAL | Status: DC
  Administered 2016-02-10: 1.5 mg via TRANSDERMAL

## 2016-02-10 MED ORDER — ARTIFICIAL TEARS OP OINT
TOPICAL_OINTMENT | OPHTHALMIC | Status: AC
  Filled 2016-02-10: qty 3.5

## 2016-02-10 MED ORDER — KETOROLAC TROMETHAMINE 30 MG/ML IJ SOLN
INTRAMUSCULAR | Status: AC
  Filled 2016-02-10: qty 1

## 2016-02-10 MED ORDER — DEXAMETHASONE SODIUM PHOSPHATE 4 MG/ML IJ SOLN
INTRAMUSCULAR | Status: AC
  Filled 2016-02-10: qty 1

## 2016-02-10 MED ORDER — PHENYLEPHRINE 40 MCG/ML (10ML) SYRINGE FOR IV PUSH (FOR BLOOD PRESSURE SUPPORT)
PREFILLED_SYRINGE | INTRAVENOUS | Status: AC
  Filled 2016-02-10: qty 10

## 2016-02-10 MED ORDER — ROCURONIUM BROMIDE 100 MG/10ML IV SOLN
INTRAVENOUS | Status: DC | PRN
  Administered 2016-02-10: 20 mg via INTRAVENOUS
  Administered 2016-02-10: 60 mg via INTRAVENOUS

## 2016-02-10 MED ORDER — HYDROMORPHONE HCL 1 MG/ML IJ SOLN
0.2500 mg | INTRAMUSCULAR | Status: DC | PRN
  Administered 2016-02-10: 0.5 mg via INTRAVENOUS

## 2016-02-10 MED ORDER — MIDAZOLAM HCL 2 MG/2ML IJ SOLN
INTRAMUSCULAR | Status: DC | PRN
  Administered 2016-02-10: 2 mg via INTRAVENOUS

## 2016-02-10 MED ORDER — HEPARIN SODIUM (PORCINE) 5000 UNIT/ML IJ SOLN
INTRAMUSCULAR | Status: AC
  Filled 2016-02-10: qty 1

## 2016-02-10 MED ORDER — EPHEDRINE 5 MG/ML INJ
INTRAVENOUS | Status: AC
  Filled 2016-02-10: qty 10

## 2016-02-10 MED ORDER — HYDROCHLOROTHIAZIDE 12.5 MG PO CAPS
12.5000 mg | ORAL_CAPSULE | Freq: Every day | ORAL | Status: DC
  Filled 2016-02-10: qty 1

## 2016-02-10 MED ORDER — LIDOCAINE HCL (CARDIAC) 20 MG/ML IV SOLN
INTRAVENOUS | Status: AC
  Filled 2016-02-10: qty 5

## 2016-02-10 MED ORDER — PROPOFOL 10 MG/ML IV BOLUS
INTRAVENOUS | Status: AC
Start: 2016-02-10 — End: 2016-02-10
  Filled 2016-02-10: qty 20

## 2016-02-10 MED ORDER — ROPIVACAINE HCL 5 MG/ML IJ SOLN
INTRAMUSCULAR | Status: AC
  Filled 2016-02-10: qty 60

## 2016-02-10 MED ORDER — HEPARIN SODIUM (PORCINE) 5000 UNIT/ML IJ SOLN
INTRAMUSCULAR | Status: DC | PRN
  Administered 2016-02-10: 5000 [IU] via SUBCUTANEOUS

## 2016-02-10 MED ORDER — LIDOCAINE HCL (CARDIAC) 20 MG/ML IV SOLN
INTRAVENOUS | Status: DC | PRN
  Administered 2016-02-10: 80 mg via INTRAVENOUS

## 2016-02-10 MED ORDER — SCOPOLAMINE 1 MG/3DAYS TD PT72
MEDICATED_PATCH | TRANSDERMAL | Status: AC
  Filled 2016-02-10: qty 1

## 2016-02-10 MED ORDER — MIDAZOLAM HCL 2 MG/2ML IJ SOLN
INTRAMUSCULAR | Status: AC
  Filled 2016-02-10: qty 2

## 2016-02-10 MED ORDER — DEXAMETHASONE SODIUM PHOSPHATE 10 MG/ML IJ SOLN
INTRAMUSCULAR | Status: DC | PRN
  Administered 2016-02-10: 4 mg via INTRAVENOUS

## 2016-02-10 MED ORDER — KETOROLAC TROMETHAMINE 30 MG/ML IJ SOLN
INTRAMUSCULAR | Status: DC | PRN
  Administered 2016-02-10: 30 mg via INTRAVENOUS

## 2016-02-10 MED ORDER — SUGAMMADEX SODIUM 200 MG/2ML IV SOLN
INTRAVENOUS | Status: AC
  Filled 2016-02-10: qty 2

## 2016-02-10 MED ORDER — LACTATED RINGERS IV SOLN: INTRAVENOUS | Status: DC

## 2016-02-10 MED ORDER — HYDROMORPHONE HCL 1 MG/ML IJ SOLN
INTRAMUSCULAR | Status: AC
  Filled 2016-02-10: qty 1

## 2016-02-10 SURGICAL SUPPLY — 61 items
BARRIER ADHS 3X4 INTERCEED (GAUZE/BANDAGES/DRESSINGS) IMPLANT
CANISTER SUCT 3000ML (MISCELLANEOUS) ×3 IMPLANT
CATH FOLEY 3WAY  5CC 16FR (CATHETERS) ×1
CATH FOLEY 3WAY 5CC 16FR (CATHETERS) ×2 IMPLANT
CLOTH BEACON ORANGE TIMEOUT ST (SAFETY) ×3 IMPLANT
CONT PATH 16OZ SNAP LID 3702 (MISCELLANEOUS) ×6 IMPLANT
COVER BACK TABLE 60X90IN (DRAPES) ×12 IMPLANT
COVER TIP SHEARS 8 DVNC (MISCELLANEOUS) ×2 IMPLANT
COVER TIP SHEARS 8MM DA VINCI (MISCELLANEOUS) ×1
DECANTER SPIKE VIAL GLASS SM (MISCELLANEOUS) ×6 IMPLANT
DEFOGGER SCOPE WARMER CLEARIFY (MISCELLANEOUS) ×3 IMPLANT
DRAPE ROBOTICS STRL (DRAPES) IMPLANT
DRSG COVADERM PLUS 2X2 (GAUZE/BANDAGES/DRESSINGS) ×12 IMPLANT
DRSG OPSITE POSTOP 3X4 (GAUZE/BANDAGES/DRESSINGS) ×3 IMPLANT
DURAPREP 26ML APPLICATOR (WOUND CARE) ×3 IMPLANT
ELECT REM PT RETURN 9FT ADLT (ELECTROSURGICAL) ×3
ELECTRODE REM PT RTRN 9FT ADLT (ELECTROSURGICAL) ×2 IMPLANT
FILTER SMOKE EVAC LAPAROSHD (FILTER) ×3 IMPLANT
GAUZE VASELINE 3X9 (GAUZE/BANDAGES/DRESSINGS) IMPLANT
GLOVE BIO SURGEON STRL SZ 6.5 (GLOVE) ×6 IMPLANT
GLOVE BIOGEL PI IND STRL 6.5 (GLOVE) ×2 IMPLANT
GLOVE BIOGEL PI IND STRL 7.0 (GLOVE) ×6 IMPLANT
GLOVE BIOGEL PI INDICATOR 6.5 (GLOVE) ×1
GLOVE BIOGEL PI INDICATOR 7.0 (GLOVE) ×3
GOWN STRL REUS W/TWL LRG LVL3 (GOWN DISPOSABLE) ×6 IMPLANT
KIT ACCESSORY DA VINCI DISP (KITS) ×1
KIT ACCESSORY DVNC DISP (KITS) ×2 IMPLANT
LEGGING LITHOTOMY PAIR STRL (DRAPES) ×3 IMPLANT
LIQUID BAND (GAUZE/BANDAGES/DRESSINGS) IMPLANT
NEEDLE INSUFFLATION 120MM (ENDOMECHANICALS) ×3 IMPLANT
OCCLUDER COLPOPNEUMO (BALLOONS) IMPLANT
PACK ROBOT WH (CUSTOM PROCEDURE TRAY) ×3 IMPLANT
PACK ROBOTIC GOWN (GOWN DISPOSABLE) ×3 IMPLANT
PACK VAGINAL MINOR WOMEN LF (CUSTOM PROCEDURE TRAY) ×3 IMPLANT
PAD PREP 24X48 CUFFED NSTRL (MISCELLANEOUS) ×6 IMPLANT
PAD TRENDELENBURG POSITION (MISCELLANEOUS) ×3 IMPLANT
SET BI-LUMEN FLTR TB AIRSEAL (TUBING) ×3 IMPLANT
SET CYSTO W/LG BORE CLAMP LF (SET/KITS/TRAYS/PACK) ×3 IMPLANT
SET IRRIG TUBING LAPAROSCOPIC (IRRIGATION / IRRIGATOR) ×6 IMPLANT
SUT VIC AB 0 CT2 27 (SUTURE) ×6 IMPLANT
SUT VIC AB 4-0 PS2 27 (SUTURE) ×6 IMPLANT
SUT VICRYL 0 UR6 27IN ABS (SUTURE) ×6 IMPLANT
SUT VLOC 180 0 9IN  GS21 (SUTURE) ×1
SUT VLOC 180 0 9IN GS21 (SUTURE) ×2 IMPLANT
SYR 50ML LL SCALE MARK (SYRINGE) ×6 IMPLANT
SYRINGE 10CC LL (SYRINGE) ×3 IMPLANT
SYSTEM CARTER THOMASON II (TROCAR) IMPLANT
SYSTEM CONVERTIBLE TROCAR (TROCAR) IMPLANT
TIP RUMI ORANGE 6.7MMX12CM (TIP) IMPLANT
TIP UTERINE 5.1X6CM LAV DISP (MISCELLANEOUS) IMPLANT
TIP UTERINE 6.7X10CM GRN DISP (MISCELLANEOUS) ×3 IMPLANT
TIP UTERINE 6.7X6CM WHT DISP (MISCELLANEOUS) IMPLANT
TIP UTERINE 6.7X8CM BLUE DISP (MISCELLANEOUS) IMPLANT
TOWEL OR 17X24 6PK STRL BLUE (TOWEL DISPOSABLE) ×9 IMPLANT
TRAY FOLEY CATH SILVER 14FR (SET/KITS/TRAYS/PACK) ×3 IMPLANT
TROCAR DILATING TIP 12MM 150MM (ENDOMECHANICALS) ×3 IMPLANT
TROCAR DISP BLADELESS 8 DVNC (TROCAR) ×2 IMPLANT
TROCAR DISP BLADELESS 8MM (TROCAR) ×1
TROCAR XCEL NON-BLD 5MMX100MML (ENDOMECHANICALS) ×3 IMPLANT
TUBING INSUFFLATION W/FILTER (TUBING) IMPLANT
WATER STERILE IRR 1000ML POUR (IV SOLUTION) ×9 IMPLANT

## 2016-02-10 NOTE — Brief Op Note (Signed)
02/10/2016  10:18 AM  PATIENT:  Kimberly Walker  50 y.o. female  PRE-OPERATIVE DIAGNOSIS:  ABNORMAL UTERINE BLEEDING, FIBROIDS, ANEMIA, RIGHT OVARIAN CYST   POST-OPERATIVE DIAGNOSIS:  ABNORMAL UTERINE BLEEDING, FIBROIDS, ANEMIA, RIGHT OVARIAN CYST, LEFT HYDROSALPINX  PROCEDURE:  Procedure(s): ROBOTIC ASSISTED TOTAL HYSTERECTOMY WITH BILATERAL SALPINGO OOPHORECTOMY AND COLLECTION OF PELVIC WASHINGS (Bilateral) CYSTOSCOPY (N/A)  SURGEON:  Surgeon(s) and Role:    * Brook E Yisroel Ramming, MD - Primary    * Megan Salon, MD - Assisting  PHYSICIAN ASSISTANT: NA  ASSISTANTS: Megan Salon, MD  ANESTHESIA:   local, general and Intraperitoneal Ropivicaine  EBL:  Total I/O In: 1000 [I.V.:1000] Out: 44 [Urine:500; Blood:25]  BLOOD ADMINISTERED:none  DRAINS: Urinary Catheter (Foley)   LOCAL MEDICATIONS USED:  MARCAINE     SPECIMEN:  Source of Specimen:  Uterus, cervix, bilateral tubes and ovaries. pelvic washings sent separately.   DISPOSITION OF SPECIMEN:  PATHOLOGY  COUNTS:  YES  TOURNIQUET:  * No tourniquets in log *  DICTATION: .Note written in [place]: Admit for overnight observation  PATIENT DISPOSITION:  PACU - hemodynamically stable.   Delay start of Pharmacological VTE agent (>24hrs) Walker to surgical blood loss or risk of bleeding: not applicable

## 2016-02-10 NOTE — Op Note (Addendum)
OPERATIVE REPORT   PREOPERATIVE DIAGNOSIS:   Menorrhagia with irregular menses, fibroids, anemia, right ovarian cyst.  POSTOPERATIVE DIAGNOSIS:   Menorrhagia with irregular menses, fibroids, anemia, right ovarian cyst, left hydrosalpinx.   PROCEDURES: Da Vinci robotic total laparoscopic hysterectomy with bilateral salpingo-oophorectomy, cystoscopy, collection of pelvic washings.  SURGEON: Lenard Galloway, M.D.  ASSISTANT: Lyman Speller, M.D.  ANESTHESIA: General endotracheal, intraperitoneal ropivicaine 60 mL diluted in 60 mL of normal saline, local with 0.25% Marcaine.  IV FLUIDS:  1000 cc LR.  ESTIMATED BLOOD LOSS:  25 cc.  URINE OUTPUT: 525 cc.  COMPLICATIONS: None.  INDICATIONS FOR THE PROCEDURE:    The patient is a 50 year old 41 2 female, status post Cesarean Section and status post tubal ligation who presents with heavy and irregular vaginal bleeding and uterine fibroids and an enlarging right ovarian cyst with a solid component consistent with a possible dermoid. A CA125 was normal.  The patient's hemoglobin in 10.7.  She declines future childbearing and she requests definitive surgical treatment.  A plan is made to proceed with a robotic total laparoscopic hysterectomy with bilateral salpingo-oophorectomy and cystoscopy and collection of pelvic washings after risks, benefits, and alternatives are reviewed.  FINDINGS:    The uterus is an 8 week size and no adnexal masses are palpated.   Laparoscopy reveals a globally enlarged uterine fundus, tubes consistent with tubal ligation and a left distal hydrosalpinx, a 2 cm left ovarian cyst, and a 2 cm solid area of the right ovary consistent with possible fibroma.  There was no ascites fluid.  There was no sign of endometriosis or adhesive disease in the pelvis.  The appendix, liver and upper abdomen appeared normal.  Cystoscopy at the termination of the procedure showed the bladder to be normal throughout 360  degrees including the bladder dome and trigone. There was no evidence of any foreign body in the bladder or the urethra. There was no evidence of any lesions  of the bladder or the urethra.  Both of the ureters were noted to be patent bilaterally.  SPECIMENS:    The uterus, cervix, and bilateral tubes were went to pathology separately from the pelvic washings.  DESCRIPTION OF PROCEDURE:   The patient was reidentified in the preoperative hold area.  She did receive Cefotetan IV for antibiotic prophylaxis. She received TED hose and PAS stockings for DVT prophylaxis.  In the operating room, the patient was placed in the dorsal lithotomy position on the operating room table. The anti-slip pad was used under the patient. Her legs were placed in the [place] stirrups and her arms were both tucked at her sides with the appropriate padded arm protectors. The patient received general endotracheal anesthesia and she then received proper padding around her head and neck.  The abdomen and vagina were then sterilely prepped and she was sterilely draped.  A speculum was placed in the vagina and a single-tooth tenaculum was placed on the anterior cervical lip. A figure-of-eight suture of 0 Vicryl was placed on each the anterior and the posterior cervical lips. The uterus was sounded to __11___ cm. The cervix was then dilated with [facility] dilators. A #  __10____ RUMI tip with a KOH ring was then placed through the cervix and into the uterine cavity without difficulty. The remaining vaginal instruments were then removed. A Foley catheter was placed inside the bladder.  Attention was turned to the abdomen where the infraumbilical region was injected with 0.25% Marcaine and a small incision created.  A Veress needle was then used to insufflate the abdomen with CO2 gas after a saline drop test was performed and the fluid flowed freely.  A 12 mm supraumbilical incision was created with a scalpel after the skin  was injected locally with 0.25% Marcaine. A 12 mm robotic camera port was then placed through this umbilical incision. 8 mm incisions were then created to the right and left of the supraumbilical incision after the skin was injected locally with 0.25% Marcaine.  The 8 mm trocars were then placed under visualization of the laparoscope.  The 5 mm incision was created in the right lower abdominal quadrant after local Marcaine was injected.  The trocar was placed under visualization of the laparoscope.   Pelvic washings were collected and sent to pathology.   Ropivicine 30 cc diluted in 30 cc normal saline was placed inside the peritoneal cavity.   At this time, the patient was placed in Trendelenburg position. An inspection of the abdomen and pelvis was performed. The findings are as noted above.   The robot was docked on the patient's left-hand side. The monopolar laparoscopic scissors was placed in robotic port #1 and the PK instrument was placed through robotic port site #2.  At this time, I stepped to the surgeon's console while my assistant and the rest of the surgical team remained at the patient's side.  At this time, the left ureter was identified.  The left infundibulopelvic ligament could be well reached and identified and it was triply cauterized with a PK instrument and then sharply divided with the laparoscopic scissors. Dissection was then continued through the broad ligament using again the PK instrument for cautery and the monopolar scissors for cutting. Care was taken to avoid the region of the ureter. The left round ligament was then cauterized with the PK instrument and sharply divided with the laparoscopic scissors. Dissection was performed to the anterior and posterior leaves of the broad ligaments again using the monopolar laparoscopic scissors. The incision was carried across the anterior cul- de-sac along the vesicouterine fold and the bladder was dissected away from the  cervix using monopolar cautery and the laparoscopic scissors. The peritoneum was taken down posteriorly. The left uterine artery was skeletonized at this time using sharp dissection and monopolar cautery.   It was then cauterized with the PK instrument and was then cut using the laparoscopic scissors.    Attention was turned to the patient's right-hand side at this time. Along the right pelvic sidewall, the ureter was identified. The infundibulopelvic ligament was identified and it was triply cauterized with the PK instrument and then bisected with the laparoscopic monopolar scissors. Dissection continued through the right broad ligament using the monopolar scissors for cautery and cutting. The right round ligament was then cauterized with the PK instrument and sharply divided with the laparoscopic monopolar scissors. Again, the anterior and posterior leaves of the broad ligament were taken down using monopolar laparoscopic scissors for dissection. The bladder flap was taken down anteriorly using the monopolar scissors. The uterine artery was skeletonized along the patient's right hand side and was then cauterized with the PK instrument and was divided with the laparoscopic scissors.    The KOH ring was nicely visible. The colpotomy incision was performed with the laparoscopic monopolar scissors in a circumferential fashion. The specimen was then removed from the peritoneal cavity and was sent to Pathology. The balloon occluder was placed in the vagina. The vaginal cuff was sutured at this time using a running  suture of 0 V-Loc. The vagina was closed from the patient's right hand side to the left hand side and then back 2 sutures towards the midline. This provided good full-thickness closure of the vaginal cuff.  The pelvis was irrigated and suctioned.  There was good hemostasis of the operative sites and pedicles.  The laparoscopic needle for suturing was parked in the anterior  peritoneum.  Final inspection of all operative sites demonstrated good hemostasis. The robotic laparoscopic instruments were removed.  The robot was undocked.  A 8-mm robotic laparoscope was placed at this time and the needle was retrieved from inside the peritoneal cavity. The laparoscopic trocars were removed under visualization of the laparoscope. The CO2 pneumoperitoneum was released. The patient received manual breaths to remove any remaining CO2 gas and then the umbilical trocar was removed.  The patient's 3-way Foley catheter was removed at this time and cystoscopy was performed and the findings are as noted above. The Foley catheter was left out at this time.  The abdominal incisions were closed. The umbilical fascia was closed with a figure-of-eight suture of 0 Vicryl. All skin incisions were closed with subcuticular sutures of 4-0 Vicryl. Dermabond was placed over the incisions and the umbilical incision was closed with a honeycomb dressing.  This concluded the patient's procedure. She was extubated and escorted to the recovery room in stable and awake condition. There were no complications to the procedure. All needle, instrument, and sponge counts were correct.     Lenard Galloway, M.D.

## 2016-02-10 NOTE — Anesthesia Postprocedure Evaluation (Signed)
Anesthesia Post Note  Patient: Kimberly Walker  Procedure(s) Performed: Procedure(s) (LRB): ROBOTIC ASSISTED TOTAL HYSTERECTOMY WITH BILATERAL SALPINGO OOPHORECTOMY AND COLLECTION OF PELVIC WASHINGS (Bilateral) CYSTOSCOPY (N/A)  Patient location during evaluation: PACU Anesthesia Type: General Level of consciousness: awake and alert Pain management: pain level controlled Vital Signs Assessment: post-procedure vital signs reviewed and stable Respiratory status: spontaneous breathing, nonlabored ventilation, respiratory function stable and patient connected to nasal cannula oxygen Cardiovascular status: blood pressure returned to baseline and stable Postop Assessment: no signs of nausea or vomiting Anesthetic complications: no     Last Vitals:  Filed Vitals:   02/10/16 1015 02/10/16 1030  BP: 130/77 122/73  Pulse: 87 75  Temp:    Resp: 14 12    Last Pain: There were no vitals filed for this visit. Pain Goal:                 Zenaida Deed

## 2016-02-10 NOTE — Anesthesia Procedure Notes (Signed)
Procedure Name: Intubation Date/Time: 02/10/2016 7:21 AM Performed by: Raenette Rover Pre-anesthesia Checklist: Patient identified, Emergency Drugs available, Suction available and Patient being monitored Patient Re-evaluated:Patient Re-evaluated prior to inductionOxygen Delivery Method: Circle system utilized Preoxygenation: Pre-oxygenation with 100% oxygen Intubation Type: IV induction Ventilation: Mask ventilation without difficulty Laryngoscope Size: Miller and 2 Grade View: Grade I Tube type: Oral Tube size: 7.0 mm Number of attempts: 1 Airway Equipment and Method: Stylet Placement Confirmation: CO2 detector,  positive ETCO2,  ETT inserted through vocal cords under direct vision and breath sounds checked- equal and bilateral Secured at: 21 cm Tube secured with: Tape Dental Injury: Teeth and Oropharynx as per pre-operative assessment

## 2016-02-10 NOTE — Progress Notes (Signed)
Update to History and Physical  Patient taking iron for anemia.  Type and screen done. Patient examined.   Ok to proceed with surgery.

## 2016-02-10 NOTE — Progress Notes (Signed)
Day of Surgery Procedure(s) (LRB): ROBOTIC ASSISTED TOTAL HYSTERECTOMY WITH BILATERAL SALPINGO OOPHORECTOMY AND COLLECTION OF PELVIC WASHINGS (Bilateral) CYSTOSCOPY (N/A)  Subjective: Patient reports nausea and no problems voiding.   Vomited once and now feels better.  Voided.  Ambulated.   Objective: I have reviewed patient's vital signs and intake and output. T max 98.1, T now 97.7, BP 110/58, P 71, RR 16 I/O - 2160 cc/900 cc UO  General: alert and cooperative Resp: clear to auscultation bilaterally Cardio: regular rate and rhythm, S1, S2 normal, no murmur, click, rub or gallop GI: incision: clean, dry and intact and hypoactive bowel sounds, soft, nontender. Extremities: Ted hose on.  DPs 2+ bilaterally.  Vaginal Bleeding: minimal  Assessment: s/p Procedure(s): ROBOTIC ASSISTED TOTAL HYSTERECTOMY WITH BILATERAL SALPINGO OOPHORECTOMY AND COLLECTION OF PELVIC WASHINGS (Bilateral) CYSTOSCOPY (N/A): progressing well  Plan: Advance diet Encourage ambulation CBC and BMP in the am.   Discussed surgical findings and procedure.  Questions answered.  Anticipate discharge home tomorrow am.      Arloa Koh 02/10/2016, 4:18 PM

## 2016-02-10 NOTE — Anesthesia Postprocedure Evaluation (Signed)
Anesthesia Post Note  Patient: Kimberly Walker  Procedure(s) Performed: Procedure(s) (LRB): ROBOTIC ASSISTED TOTAL HYSTERECTOMY WITH BILATERAL SALPINGO OOPHORECTOMY AND COLLECTION OF PELVIC WASHINGS (Bilateral) CYSTOSCOPY (N/A)  Patient location during evaluation: [facility] Anesthesia Type: General Level of consciousness: awake and alert Pain management: satisfactory to patient Vital Signs Assessment: post-procedure vital signs reviewed and stable Respiratory status: spontaneous breathing and respiratory function stable Cardiovascular status: stable Postop Assessment: adequate PO intake Anesthetic complications: no     Last Vitals:  Filed Vitals:   02/10/16 1120 02/10/16 1227  BP: 118/66 110/58  Pulse: 73 71  Temp: 36.9 C 36.5 C  Resp: 16 16    Last Pain:  Filed Vitals:   02/10/16 1531  PainSc: 5    Pain Goal: Patients Stated Pain Goal: 4 (02/10/16 1531)               Katherina Mires

## 2016-02-10 NOTE — Addendum Note (Signed)
Addendum  created 02/10/16 1552 by Flossie Dibble, CRNA   Modules edited: Notes Section   Notes Section:  File: [id number]

## 2016-02-10 NOTE — Transfer of Care (Signed)
Immediate Anesthesia Transfer of Care Note  Patient: Kimberly Walker  Procedure(s) Performed: Procedure(s): ROBOTIC ASSISTED TOTAL HYSTERECTOMY WITH BILATERAL SALPINGO OOPHORECTOMY AND COLLECTION OF PELVIC WASHINGS (Bilateral) CYSTOSCOPY (N/A)  Patient Location: PACU  Anesthesia Type:General  Level of Consciousness: awake, alert , oriented and patient cooperative  Airway & Oxygen Therapy: Patient Spontanous Breathing and Patient connected to nasal cannula oxygen  Post-op Assessment: Report given to RN and Post -op Vital signs reviewed and stable  Post vital signs: Reviewed and stable  Last Vitals:  Filed Vitals:   02/10/16 0606  BP: 152/94  Pulse: 80  Temp: 36.7 C  Resp: 20    Last Pain: There were no vitals filed for this visit.       Complications: No apparent anesthesia complications

## 2016-02-10 NOTE — OR Nursing (Signed)
SPECIMEN WEIGHS 302.9 GRAMS

## 2016-02-11 DIAGNOSIS — D270 Benign neoplasm of right ovary: Secondary | ICD-10-CM | POA: Diagnosis not present

## 2016-02-11 LAB — CBC
HCT: 31.5 % — ABNORMAL LOW (ref 36.0–46.0)
Hemoglobin: 10.8 g/dL — ABNORMAL LOW (ref 12.0–15.0)
MCH: 31.6 pg (ref 26.0–34.0)
MCHC: 34.3 g/dL (ref 30.0–36.0)
MCV: 92.1 fL (ref 78.0–100.0)
PLATELETS: 241 10*3/uL (ref 150–400)
RBC: 3.42 MIL/uL — ABNORMAL LOW (ref 3.87–5.11)
RDW: 13.1 % (ref 11.5–15.5)
WBC: 11.7 10*3/uL — ABNORMAL HIGH (ref 4.0–10.5)

## 2016-02-11 LAB — BASIC METABOLIC PANEL
Anion gap: 6 (ref 5–15)
BUN: 12 mg/dL (ref 6–20)
CALCIUM: 8.7 mg/dL — AB (ref 8.9–10.3)
CO2: 29 mmol/L (ref 22–32)
CREATININE: 1.03 mg/dL — AB (ref 0.44–1.00)
Chloride: 100 mmol/L — ABNORMAL LOW (ref 101–111)
Glucose, Bld: 93 mg/dL (ref 65–99)
Potassium: 3.5 mmol/L (ref 3.5–5.1)
SODIUM: 135 mmol/L (ref 135–145)

## 2016-02-11 MED ORDER — IBUPROFEN 600 MG PO TABS: 600.0000 mg | ORAL_TABLET | Freq: Four times a day (QID) | ORAL | Status: DC | PRN

## 2016-02-11 MED ORDER — OXYCODONE-ACETAMINOPHEN 5-325 MG PO TABS: 1.0000 | ORAL_TABLET | ORAL | Status: DC | PRN

## 2016-02-11 NOTE — Progress Notes (Signed)
Patient discharged home with daughter. Discharge instructions and paperwork reviewed with patient. Prescriptions given to patient. No questions at this time.

## 2016-02-11 NOTE — Discharge Instructions (Signed)
Total Laparoscopic Hysterectomy, Care After °Refer to this sheet in the next few weeks. These instructions provide you with information on caring for yourself after your procedure. Your health care provider may also give you more specific instructions. Your treatment has been planned according to current medical practices, but problems sometimes occur. Call your health care provider if you have any problems or questions after your procedure. °WHAT TO EXPECT AFTER THE PROCEDURE °· Pain and bruising at the incision sites. You will be given pain medicine to control it. °· Menopausal symptoms such as hot flashes, night sweats, and insomnia if your ovaries were removed. °· Sore throat from the breathing tube that was inserted during surgery. °HOME CARE INSTRUCTIONS °· Only take over-the-counter or prescription medicines for pain, discomfort, or fever as directed by your health care provider.   °· Do not take aspirin. It can cause bleeding.   °· Do not drive when taking pain medicine.   °· Follow your health care provider's advice regarding diet, exercise, lifting, driving, and general activities.   °· Resume your usual diet as directed and allowed.   °· Get plenty of rest and sleep.   °· Do not douche, use tampons, or have sexual intercourse for at least 6 weeks, or until your health care provider gives you permission.   °· Change your bandages (dressings) as directed by your health care provider.   °· Monitor your temperature and notify your health care provider of a fever.   °· Take showers instead of baths for 2-3 weeks.   °· Do not drink alcohol until your health care provider gives you permission.   °· If you develop constipation, you may take a mild laxative with your health care provider's permission. Bran foods may help with constipation problems. Drinking enough fluids to keep your urine clear or pale yellow may help as well.   °· Try to have someone home with you for 1-2 weeks to help around the house.    °· Keep all of your follow-up appointments as directed by your health care provider.   °SEEK MEDICAL CARE IF: °· You have swelling, redness, or increasing pain around your incision sites.   °· You have pus coming from your incision.   °· You notice a bad smell coming from your incision.   °· Your incision breaks open.   °· You feel dizzy or lightheaded.   °· You have pain or bleeding when you urinate.   °· You have persistent diarrhea.   °· You have persistent nausea and vomiting.   °· You have abnormal vaginal discharge.   °· You have a rash.   °· You have any type of abnormal reaction or develop an allergy to your medicine.   °· You have poor pain control with your prescribed medicine.   °SEEK IMMEDIATE MEDICAL CARE IF: °· You have chest pain or shortness of breath. °· You have severe abdominal pain that is not relieved with pain medicine. °· You have pain or swelling in your legs. °MAKE SURE YOU: °· Understand these instructions. °· Will watch your condition. °· Will get help right away if you are not doing well or get worse. °  °This information is not intended to replace advice given to you by your health care provider. Make sure you discuss any questions you have with your health care provider. °  °Document Released: 05/02/2013 Document Revised: 07/17/2013 Document Reviewed: 05/02/2013 °Elsevier Interactive Patient Education ©2016 Elsevier Inc. ° °

## 2016-02-11 NOTE — Progress Notes (Signed)
1 Day Post-Op Procedure(s) (LRB): ROBOTIC ASSISTED TOTAL HYSTERECTOMY WITH BILATERAL SALPINGO OOPHORECTOMY AND COLLECTION OF PELVIC WASHINGS (Bilateral) CYSTOSCOPY (N/A)  Subjective: Patient reports nausea, tolerating PO and no problems voiding.   Received Phenergan last hs for nausea.  Better now.  Not needs a vaginal pad.  No significant bleeding.   Objective: I have reviewed patient's vital signs, intake and output and labs. T max - 99, T now 98.7, BP 116/63, P 73, RR 18. I/O - 4787 cc/2975 cc. Hgb 10.8.  General: alert and cooperative Resp: clear to auscultation bilaterally Cardio: regular rate and rhythm, S1, S2 normal, no murmur, click, rub or gallop GI: soft, non-tender; bowel sounds normal; no masses,  no organomegaly and incision: clean, dry, intact and steristrip placed over supraumbilical incision.  Extremities: Ted hose on.   Assessment: s/p Procedure(s): ROBOTIC ASSISTED TOTAL HYSTERECTOMY WITH BILATERAL SALPINGO OOPHORECTOMY AND COLLECTION OF PELVIC WASHINGS (Bilateral) CYSTOSCOPY (N/A): progressing well  Ready for discharge.   Plan: Discharge home  Instructions and precautions reviewed in orla and written form.  Fe po q day.  Percocet and Motrin for pain.  Follow up in 5 days.      Daichi Moris A Quincy Simmonds 02/11/2016, 8:02 AM

## 2016-02-13 ENCOUNTER — Encounter (HOSPITAL_COMMUNITY): Payer: Self-pay | Admitting: Obstetrics and Gynecology

## 2016-02-16 ENCOUNTER — Encounter: Payer: Self-pay | Admitting: Obstetrics and Gynecology

## 2016-02-16 ENCOUNTER — Ambulatory Visit (INDEPENDENT_AMBULATORY_CARE_PROVIDER_SITE_OTHER): Payer: PRIVATE HEALTH INSURANCE | Admitting: Obstetrics and Gynecology

## 2016-02-16 VITALS — BP 124/60 | HR 72 | Temp 98.3°F | Resp 16 | Ht 64.0 in | Wt 162.0 lb

## 2016-02-16 DIAGNOSIS — D649 Anemia, unspecified: Secondary | ICD-10-CM

## 2016-02-16 DIAGNOSIS — Z9889 Other specified postprocedural states: Secondary | ICD-10-CM

## 2016-02-16 LAB — CBC
HCT: 34.5 % — ABNORMAL LOW (ref 35.0–45.0)
HEMOGLOBIN: 11.6 g/dL — AB (ref 11.7–15.5)
MCH: 31.2 pg (ref 27.0–33.0)
MCHC: 33.6 g/dL (ref 32.0–36.0)
MCV: 92.7 fL (ref 80.0–100.0)
MPV: 9.9 fL (ref 7.5–12.5)
Platelets: 359 10*3/uL (ref 140–400)
RBC: 3.72 MIL/uL — ABNORMAL LOW (ref 3.80–5.10)
RDW: 13 % (ref 11.0–15.0)
WBC: 9.1 10*3/uL (ref 3.8–10.8)

## 2016-02-16 NOTE — Patient Instructions (Signed)
Menopause and Herbal Products WHAT IS MENOPAUSE? Menopause is the normal time of life when menstrual periods decrease in frequency and eventually stop completely. This process can take several years for some women. Menopause is complete when you have had an absence of menstruation for a full year since your last menstrual period. It usually occurs between the ages of 48 and 55. It is not common for menopause to begin before the age of 40. During menopause, your body stops producing the female hormones estrogen and progesterone. Common symptoms associated with this loss of hormones (vasomotor symptoms) are:  Hot flashes.  Hot flushes.  Night sweats. Other common symptoms and complications of menopause include:  Decrease in sex drive.  Vaginal dryness and thinning of the walls of the vagina. This can make sex painful.  Dryness of the skin and development of wrinkles.  Headaches.  Tiredness.  Irritability.  Memory problems.  Weight gain.  Bladder infections.  Hair growth on the face and chest.  Inability to reproduce offspring (infertility).  Loss of density in the bones (osteoporosis) increasing your risk for breaks (fractures).  Depression.  Hardening and narrowing of the arteries (atherosclerosis). This increases your risk of heart attack and stroke. WHAT TREATMENT OPTIONS ARE AVAILABLE? There are many treatment choices for menopause symptoms. The most common treatment is hormone replacement therapy. Many alternative therapies for menopause are emerging, including the use of herbal products. These supplements can be found in the form of herbs, teas, oils, tinctures, and pills. Common herbal supplements for menopause are made from plants that contain phytoestrogens. Phytoestrogens are compounds that occur naturally in plants and plant products. They act like estrogen in the body. Foods and herbs that contain phytoestrogens include:  Soy.  Flax seeds.  Red  clover.  Ginseng. WHAT MENOPAUSE SYMPTOMS MAY BE HELPED IF I USE HERBAL PRODUCTS?  Vasomotor symptoms. These may be helped by:  Soy. Some studies show that soy may have a moderate benefit for hot flashes.  Black cohosh. There is limited evidence indicating this may be beneficial for hot flashes.  Symptoms that are related to heart and blood vessel disease. These may be helped by soy. Studies have shown that soy can help to lower cholesterol.  Depression. This may be helped by:  St. John's wort. There is limited evidence that shows this may help mild to moderate depression.  Black cohosh. There is evidence that this may help depression and mood swings.  Osteoporosis. Soy may help to decrease bone loss that is associated with menopause and may prevent osteoporosis. Limited evidence indicates that red clover may offer some bone loss protection as well. Other herbal products that are commonly used during menopause lack enough evidence to support their use as a replacement for conventional menopause therapies. These products include evening primrose, ginseng, and red clover. WHAT ARE THE CASES WHEN HERBAL PRODUCTS SHOULD NOT BE USED DURING MENOPAUSE? Do not use herbal products during menopause without your health care provider's approval if:  You are taking medicine.  You have a preexisting liver condition. ARE THERE ANY RISKS IN MY TAKING HERBAL PRODUCTS DURING MENOPAUSE? If you choose to use herbal products to help with symptoms of menopause, keep in mind that:  Different supplements have different and unmeasured amounts of herbal ingredients.  Herbal products are not regulated the same way that medicines are.  Concentrations of herbs may vary depending on the way they are prepared. For example, the concentration may be different in a pill, tea, oil, and tincture.    Little is known about the risks of using herbal products, particularly the risks of long-term use.  Some herbal  supplements can be harmful when combined with certain medicines. Most commonly reported side effects of herbal products are mild. However, if used improperly, many herbal supplements can cause serious problems. Talk to your health care provider before starting any herbal product. If problems develop, stop taking the supplement and let your health care provider know.   This information is not intended to replace advice given to you by your health care provider. Make sure you discuss any questions you have with your health care provider.   Document Released: 12/29/2007 Document Revised: 08/02/2014 Document Reviewed: 12/25/2013 Elsevier Interactive Patient Education 2016 Elsevier Inc.  

## 2016-02-16 NOTE — Progress Notes (Signed)
GYNECOLOGY  VISIT   HPI: 50 y.o.   Married  [place] American  female   [phone] with Patient's last menstrual period was 01/11/2016.   here for   Post op after hysterectomy. Daughter present for visit today.  Pathology showed benign fibroids and benign right ovarian dermoid cyst. Peritoneal washings reactive mesothelial cells.  Good bowel and bladder function.  Not using narcotics.  No vaginal bleeding.   Not sure if she is having hot flashes.  Some increased heat at night.   Taking iron once daily in the am.   GYNECOLOGIC HISTORY: Patient's last menstrual period was 01/11/2016. Contraception:  hysterectomy Menopausal hormone therapy:  none Last mammogram:  06/13/15 BIRADS 1 negative Last pap smear:   02-22-14 Neg        OB History    Gravida Para Term Preterm AB Living   2 2 2     2    SAB TAB Ectopic Multiple Live Births                     Patient Active Problem List   Diagnosis Date Noted  . Status post laparoscopic hysterectomy 02/10/2016  . Absolute anemia 09/08/2015  . Essential hypertension 07/12/2014  . Tension headache 07/12/2014    Past Medical History:  Diagnosis Date  . Abnormal uterine bleeding   . Anemia   . Frequent headaches   . Hyperlipidemia   . Hypertension   . Migraine   . Seizure [facility])    accompany migraines    Past Surgical History:  Procedure Laterality Date  . CESAREAN SECTION  1988  . CYSTOSCOPY N/A 02/10/2016   Procedure: CYSTOSCOPY;  Surgeon: Nunzio Cobbs, MD;  Location: [facility];  Service: Gynecology;  Laterality: N/A;  . ROBOTIC ASSISTED TOTAL HYSTERECTOMY WITH BILATERAL SALPINGO OOPHERECTOMY Bilateral 02/10/2016   Procedure: ROBOTIC ASSISTED TOTAL HYSTERECTOMY WITH BILATERAL SALPINGO OOPHORECTOMY AND COLLECTION OF PELVIC WASHINGS;  Surgeon: Nunzio Cobbs, MD;  Location: [facility];  Service: Gynecology;  Laterality: Bilateral;  . TUBAL LIGATION      Current Outpatient Prescriptions  Medication Sig Dispense  Refill  . ferrous sulfate 325 (65 FE) MG tablet Take 325 mg by mouth daily with breakfast.    . hydrochlorothiazide (MICROZIDE) 12.5 MG capsule Take 1 capsule (12.5 mg total) by mouth daily. 90 capsule 3  . ibuprofen (ADVIL,MOTRIN) 600 MG tablet Take 1 tablet (600 mg total) by mouth every 6 (six) hours as needed (mild pain). 30 tablet 0  . oxyCODONE-acetaminophen (PERCOCET/ROXICET) 5-325 MG tablet Take 1-2 tablets by mouth every 4 (four) hours as needed for severe pain (moderate to severe pain (when tolerating fluids)). 30 tablet 0   No current facility-administered medications for this visit.      ALLERGIES: Review of patient's allergies indicates no known allergies.  Family History  Problem Relation Age of Onset  . Diabetes Mother   . Hypertension Mother     Social History   Social History  . Marital status: Married    Spouse name: N/A  . Number of children: N/A  . Years of education: N/A   Occupational History  . Not on file.   Social History Main Topics  . Smoking status: Never Smoker  . Smokeless tobacco: Never Used  . Alcohol use No  . Drug use: No  . Sexual activity: Yes    Partners: Male    Birth control/ protection: Surgical     Comment: Tubal   Other Topics  Concern  . Not on file   Social History Narrative   Updated 08/2015   Work or School: production - boarding [employer]      Home Situation: Lives with husband - feels safe at home      Spiritual Beliefs: Christian      Lifestyle: no regular aerobic exercise, diet so so          ROS:  Pertinent items are noted in HPI.  PHYSICAL EXAMINATION:    BP 124/60 (BP Location: Right Arm, Patient Position: Sitting, Cuff Size: Normal)   Pulse 72   Temp 98.3 F (36.8 C)   Resp 16   Ht 5\' 4"  (1.626 m)   Wt 162 lb (73.5 kg)   LMP 01/11/2016   BMI 27.81 kg/m     General appearance: alert, cooperative and appears stated age Abdomen: incision(s):Yes.  , ___laparoscopic_incisions intact._____  Soft,  non-tender, no masses,  no organomegaly Ext:  Ted hose on. Pelvic:  Deferred.   Chaperone was present for exam.  ASSESSMENT  Status post robotic laparoscopic total laparoscopic hysterectomy with bilateral salpingo-oophorectomy and collection of pelvic washings.  Surgical menopause. Doing well.   Anemia.   PLAN  Check CBC now.  Discussed pathology reports.  Continue decreased activity.  I discussed ERT and herbal treatment for menopausal symptoms.  I reviewed risks of ERT - DVT, PE, stroke, and possible MI and breast cancer.  She declines this. Follow up for 6 week post op check.   An After Visit Summary was printed and given to the patient.

## 2016-02-16 NOTE — Discharge Summary (Signed)
Physician Discharge Summary  Patient ID: Kimberly Walker MRN: [id number] DOB/AGE: 1966/02/19 50 y.o.  Admit date: 02/10/2016 Discharge date:  02/11/16  Admission Diagnoses: 1.  Menorrhagia with irregular menses. 2.  Fibroids.  3.  Anemia.  4.  Right ovarian cyst.   Discharge Diagnoses:  1.  Menorrhagia with irregular menses. 2.  Fibroids.  3.  Anemia.  4.  Right ovarian cyst.  5.  Status post Da Vinci robotic total laparoscopic hysterectomy with bilateral salpingo-oophorectomy, cystoscopy, collection of pelvic washings.   Active Problems:   Status post laparoscopic hysterectomy   Discharged Condition: good  Hospital Course:  The patient was admitted on 02/11/16 for a Da Vinci robotic total laparoscopic hysterectomy with bilateral salpingo-oophorectomy, cystoscopy, collection of pelvic washings which were performed without complication while under general anesthesia.  The patient's post op course was uneventful.  She had a morphine and Toradol for pain control initially, and this was converted over to Percocet and Motrin on post op day one when the patient began taking po well.  She ambulated independently and wore PAS and Ted hose for DVT prophylaxis while in bed.  Her foley catheter was left out a the termination of surgery, and she voided good volumes on post op day zero. The patient's vital signs remained stable and she demonstrated no signs of infection during her hospitalization.  The patient's post op day one Hgb was 10.8.   She was tolerating the this well.  She had very minimal vaginal bleeding, and her incision(s) demonstrated no signs of erythema or significant drainage.  She was found to be in good condition and ready for discharge on post op day one.   Consults: None  Significant Diagnostic Studies: labs:  See Hospital course.   Treatments: surgery:  Da Vinci robotic total laparoscopic hysterectomy with bilateral salpingo-oophorectomy, cystoscopy, collection of pelvic  washings on 02/10/16.  Discharge Exam: Blood pressure 116/63, pulse 73, temperature 98.7 F (37.1 C), temperature source Oral, resp. rate 18, height 5\' 5"  (1.651 m), weight 166 lb (75.3 kg), last menstrual period 01/11/2016, SpO2 100 %. General: alert and cooperative Resp: clear to auscultation bilaterally Cardio: regular rate and rhythm, S1, S2 normal, no murmur, click, rub or gallop GI: soft, non-tender; bowel sounds normal; no masses,  no organomegaly and incision: clean, dry, intact and steristrip placed over supraumbilical incision.  Extremities: Ted hose on.   Disposition: 01-Home or Self Care Discharge instructions and precautions were reviewed in verbal and written form.     Medication List    TAKE these medications   ferrous sulfate 325 (65 FE) MG tablet Take 325 mg by mouth daily with breakfast.   hydrochlorothiazide 12.5 MG capsule Commonly known as:  MICROZIDE Take 1 capsule (12.5 mg total) by mouth daily.   ibuprofen 600 MG tablet Commonly known as:  ADVIL,MOTRIN Take 1 tablet (600 mg total) by mouth every 6 (six) hours as needed (mild pain). What changed:  medication strength  how much to take  when to take this  reasons to take this   oxyCODONE-acetaminophen 5-325 MG tablet Commonly known as:  PERCOCET/ROXICET Take 1-2 tablets by mouth every 4 (four) hours as needed for severe pain (moderate to severe pain (when tolerating fluids)).      Follow-up Information    Arloa Koh, MD In 5 days.   Specialty:  Obstetrics and Gynecology Contact information: [address] [place] [address] [phone]           Signed:  Rawson Minix A Quincy Simmonds 02/16/2016, 12:26 PM

## 2016-02-19 ENCOUNTER — Telehealth: Payer: Self-pay

## 2016-02-19 NOTE — Telephone Encounter (Signed)
Return call to Amanda.  °

## 2016-02-19 NOTE — Telephone Encounter (Signed)
Called patient at [phone] to discuss lab results, LMOVM to call me.

## 2016-02-19 NOTE — Telephone Encounter (Signed)
-----   Message from Nunzio Cobbs, MD sent at 02/18/2016  6:04 AM EDT ----- Please report hemoglobin 11.6 to patient.  Have her continue her iron daily until her 6 week post op visit.

## 2016-02-20 NOTE — Telephone Encounter (Signed)
Patient notified of lab results on 02-19-16 at 11:33am.

## 2016-02-23 ENCOUNTER — Encounter: Payer: Self-pay | Admitting: Obstetrics and Gynecology

## 2016-02-23 ENCOUNTER — Ambulatory Visit (INDEPENDENT_AMBULATORY_CARE_PROVIDER_SITE_OTHER): Payer: PRIVATE HEALTH INSURANCE | Admitting: Obstetrics and Gynecology

## 2016-02-23 ENCOUNTER — Telehealth: Payer: Self-pay | Admitting: Obstetrics and Gynecology

## 2016-02-23 VITALS — BP 118/70 | HR 80 | Temp 98.6°F | Resp 16 | Ht 64.0 in | Wt 163.0 lb

## 2016-02-23 DIAGNOSIS — N99821 Postprocedural hemorrhage and hematoma of a genitourinary system organ or structure following other procedure: Secondary | ICD-10-CM

## 2016-02-23 DIAGNOSIS — IMO0002 Reserved for concepts with insufficient information to code with codable children: Secondary | ICD-10-CM

## 2016-02-23 DIAGNOSIS — R300 Dysuria: Secondary | ICD-10-CM

## 2016-02-23 NOTE — Progress Notes (Signed)
GYNECOLOGY  VISIT   HPI: 50 y.o.   Married  [place] American  female   [phone] with Patient's last menstrual period was 01/11/2016.   here for post-op vaginal bleeding since Friday.    Status post robotic total laparoscopic hysterectomy with BSO on 02/10/16. Notes blood with wiping when she voids.  Thinks it is vaginal. Not a lot of blood.  No further bleeding today.  Denies abdominal pain. Can feels a knot in her pelvis sometimes.  Sharp pain on the right posterior hip with bending over to put socks on.   Random dysuria.  Gave a urine specimen today.   Denies fever.   Having BMs.  Had diarrhea which resolved.  Today some constipation but did have a bowel movement.   Urine dip - 1 - 2+ RBCs.  GYNECOLOGIC HISTORY: Patient's last menstrual period was 01/11/2016. Contraception:  Hysterectomy Menopausal hormone therapy:  none Last mammogram:  06/13/15 BIRADS1 Last pap smear:   02/22/14 WNL        OB History    Gravida Para Term Preterm AB Living   2 2 2     2    SAB TAB Ectopic Multiple Live Births                     Patient Active Problem List   Diagnosis Date Noted  . Status post laparoscopic hysterectomy 02/10/2016  . Absolute anemia 09/08/2015  . Essential hypertension 07/12/2014  . Tension headache 07/12/2014    Past Medical History:  Diagnosis Date  . Abnormal uterine bleeding   . Anemia   . Frequent headaches   . Hyperlipidemia   . Hypertension   . Migraine   . Seizure [facility])    accompany migraines    Past Surgical History:  Procedure Laterality Date  . CESAREAN SECTION  1988  . CYSTOSCOPY N/A 02/10/2016   Procedure: CYSTOSCOPY;  Surgeon: Nunzio Cobbs, MD;  Location: [facility];  Service: Gynecology;  Laterality: N/A;  . ROBOTIC ASSISTED TOTAL HYSTERECTOMY WITH BILATERAL SALPINGO OOPHERECTOMY Bilateral 02/10/2016   Procedure: ROBOTIC ASSISTED TOTAL HYSTERECTOMY WITH BILATERAL SALPINGO OOPHORECTOMY AND COLLECTION OF PELVIC WASHINGS;  Surgeon:  Nunzio Cobbs, MD;  Location: [facility];  Service: Gynecology;  Laterality: Bilateral;  . TUBAL LIGATION      Current Outpatient Prescriptions  Medication Sig Dispense Refill  . ferrous sulfate 325 (65 FE) MG tablet Take 325 mg by mouth daily with breakfast.    . hydrochlorothiazide (MICROZIDE) 12.5 MG capsule Take 1 capsule (12.5 mg total) by mouth daily. 90 capsule 3  . ibuprofen (ADVIL,MOTRIN) 600 MG tablet Take 1 tablet (600 mg total) by mouth every 6 (six) hours as needed (mild pain). 30 tablet 0  . oxyCODONE-acetaminophen (PERCOCET/ROXICET) 5-325 MG tablet Take 1-2 tablets by mouth every 4 (four) hours as needed for severe pain (moderate to severe pain (when tolerating fluids)). 30 tablet 0   No current facility-administered medications for this visit.      ALLERGIES: Review of patient's allergies indicates no known allergies.  Family History  Problem Relation Age of Onset  . Diabetes Mother   . Hypertension Mother     Social History   Social History  . Marital status: Married    Spouse name: N/A  . Number of children: N/A  . Years of education: N/A   Occupational History  . Not on file.   Social History Main Topics  . Smoking status: Never Smoker  . Smokeless  tobacco: Never Used  . Alcohol use No  . Drug use: No  . Sexual activity: Yes    Partners: Male    Birth control/ protection: Surgical     Comment: Tubal   Other Topics Concern  . Not on file   Social History Narrative   Updated 08/2015   Work or School: production - boarding [employer]      Home Situation: Lives with husband - feels safe at home      Spiritual Beliefs: Christian      Lifestyle: no regular aerobic exercise, diet so so          ROS:  Pertinent items are noted in HPI.  PHYSICAL EXAMINATION:    LMP 01/11/2016     General appearance: alert, cooperative and appears stated age   Abdomen: incisions intact, soft, non-tender, no masses,  no organomegaly   Pelvic: External  genitalia:  no lesions              Urethra:  normal appearing urethra with no masses, tenderness or lesions              Bartholins and Skenes: normal                 Vagina:  Vaginal vault intact.  Mild amount of red blood noted.               Cervix:  Absent.                 Bimanual Exam:  Uterus:   Absent.              Adnexa: no mass, fullness, tenderness             Chaperone was present for exam.  ASSESSMENT  Vaginal bleeding from vaginal cuff post hysterectomy.  No cuff cellulitis or hematoma.  Dysuria.   PLAN  Reassurance regarding vaginal bleeding.  Discussed decreased activity.  Call for increased bleeding, pain, or fever.  Check urine micro and culture.  Follow up in one week.    An After Visit Summary was printed and given to the patient.

## 2016-02-23 NOTE — Telephone Encounter (Signed)
Call to patient. She is noticing blood on toilet tissue each times she uses the restroom. No recent increase in activity or anything in vagina.   No dysuria, abdominal pain, or fevers. No vaginal discharge.   Patient offered and accepts appointment today with Dr. Quincy Simmonds for post op vaginal bleeding.   She is agreeable and will call back with any questions prior to appointment.  Routing to provider for final review. Patient agreeable to disposition. Will close encounter.

## 2016-02-23 NOTE — Telephone Encounter (Signed)
Patient called and left a message on the answering machine at over the weekend. She said, "I had surgery on 02/10/16 and ever since Friday, 02/20/16, I have been having some blood showing when I wipe after I pee. It did not have a lot of clots or anything, its just a smear. I just need to speak with a nurse when you all get into the office on Monday."

## 2016-02-23 NOTE — Patient Instructions (Signed)
Take Colace or Pericolace daily to reduce constipation and straining.

## 2016-02-24 LAB — URINALYSIS, MICROSCOPIC ONLY
BACTERIA UA: NONE SEEN [HPF]
CASTS: NONE SEEN [LPF]
CRYSTALS: NONE SEEN [HPF]
RBC / HPF: NONE SEEN RBC/HPF (ref <=2)
SQUAMOUS EPITHELIAL / LPF: NONE SEEN [HPF] (ref <=5)
WBC, UA: NONE SEEN WBC/HPF (ref <=5)
Yeast: NONE SEEN [HPF]

## 2016-02-25 ENCOUNTER — Telehealth: Payer: Self-pay | Admitting: *Deleted

## 2016-02-25 LAB — URINE CULTURE: Organism ID, Bacteria: NO GROWTH

## 2016-02-25 NOTE — Telephone Encounter (Signed)
Patient notified of normal urine micro results. Patient verbalized understanding.

## 2016-03-01 ENCOUNTER — Ambulatory Visit (INDEPENDENT_AMBULATORY_CARE_PROVIDER_SITE_OTHER): Payer: PRIVATE HEALTH INSURANCE | Admitting: Obstetrics and Gynecology

## 2016-03-01 ENCOUNTER — Encounter: Payer: Self-pay | Admitting: Obstetrics and Gynecology

## 2016-03-01 VITALS — BP 124/82 | HR 80 | Resp 16 | Ht 64.0 in | Wt 163.8 lb

## 2016-03-01 DIAGNOSIS — N939 Abnormal uterine and vaginal bleeding, unspecified: Secondary | ICD-10-CM

## 2016-03-01 NOTE — Progress Notes (Signed)
GYNECOLOGY  VISIT   HPI: 50 y.o.   Married  [place] American  female   [phone] with Patient's last menstrual period was 01/11/2016.   here for 1 week f/u to recheck incision.     Still having some mild bleeding.  Taking Motrin every 6 hours for pelvic pain.  Sleeping and eating well.   Asking about OTC meds for menopausal symptoms.   GYNECOLOGIC HISTORY: Patient's last menstrual period was 01/11/2016. Contraception:  Hysterectomy Menopausal hormone therapy:  None Last mammogram:  06/13/15 BIRADS1 Last pap smear:   02/12/14 WNL        OB History    Gravida Para Term Preterm AB Living   2 2 2     2    SAB TAB Ectopic Multiple Live Births                     Patient Active Problem List   Diagnosis Date Noted  . Status post laparoscopic hysterectomy 02/10/2016  . Absolute anemia 09/08/2015  . Essential hypertension 07/12/2014  . Tension headache 07/12/2014    Past Medical History:  Diagnosis Date  . Abnormal uterine bleeding   . Anemia   . Frequent headaches   . Hyperlipidemia   . Hypertension   . Migraine   . Seizure [facility])    accompany migraines    Past Surgical History:  Procedure Laterality Date  . CESAREAN SECTION  1988  . CYSTOSCOPY N/A 02/10/2016   Procedure: CYSTOSCOPY;  Surgeon: Nunzio Cobbs, MD;  Location: [facility];  Service: Gynecology;  Laterality: N/A;  . ROBOTIC ASSISTED TOTAL HYSTERECTOMY WITH BILATERAL SALPINGO OOPHERECTOMY Bilateral 02/10/2016   Procedure: ROBOTIC ASSISTED TOTAL HYSTERECTOMY WITH BILATERAL SALPINGO OOPHORECTOMY AND COLLECTION OF PELVIC WASHINGS;  Surgeon: Nunzio Cobbs, MD;  Location: [facility];  Service: Gynecology;  Laterality: Bilateral;  . TUBAL LIGATION      Current Outpatient Prescriptions  Medication Sig Dispense Refill  . ferrous sulfate 325 (65 FE) MG tablet Take 325 mg by mouth daily with breakfast.    . hydrochlorothiazide (MICROZIDE) 12.5 MG capsule Take 1 capsule (12.5 mg total) by mouth daily. 90  capsule 3  . ibuprofen (ADVIL,MOTRIN) 600 MG tablet Take 1 tablet (600 mg total) by mouth every 6 (six) hours as needed (mild pain). 30 tablet 0  . oxyCODONE-acetaminophen (PERCOCET/ROXICET) 5-325 MG tablet Take 1-2 tablets by mouth every 4 (four) hours as needed for severe pain (moderate to severe pain (when tolerating fluids)). 30 tablet 0   No current facility-administered medications for this visit.      ALLERGIES: Review of patient's allergies indicates no known allergies.  Family History  Problem Relation Age of Onset  . Diabetes Mother   . Hypertension Mother     Social History   Social History  . Marital status: Married    Spouse name: N/A  . Number of children: N/A  . Years of education: N/A   Occupational History  . Not on file.   Social History Main Topics  . Smoking status: Never Smoker  . Smokeless tobacco: Never Used  . Alcohol use No  . Drug use: No  . Sexual activity: Yes    Partners: Male    Birth control/ protection: Surgical     Comment: Tubal   Other Topics Concern  . Not on file   Social History Narrative   Updated 08/2015   Work or School: production - boarding [employer]  Home Situation: Lives with husband - feels safe at home      Spiritual Beliefs: Christian      Lifestyle: no regular aerobic exercise, diet so so          ROS:  Pertinent items are noted in HPI.  PHYSICAL EXAMINATION:    BP 124/82 (BP Location: Right Arm, Patient Position: Sitting, Cuff Size: Normal)   Pulse 80   Resp 16   Ht 5\' 4"  (1.626 m)   Wt 163 lb 12.8 oz (74.3 kg)   LMP 01/11/2016   BMI 28.12 kg/m     General appearance: alert, cooperative and appears stated age   Abdomen: incisions intact, soft, non-tender, no masses,  no organomegaly   Pelvic: External genitalia:  no lesions              Urethra:  normal appearing urethra with no masses, tenderness or lesions              Bartholins and Skenes: normal                 Vagina:   4 mm area of  friability of the right mid cuff.  Monsel's placed.              Cervix:  Absent.                 Bimanual Exam:  Uterus: absent.               Adnexa: no mass, fullness, tenderness            Chaperone was present for exam.  ASSESSMENT  Minor vaginal cuff bleeding status post robotic hysterectomy.  No cellulitis.  Menopausal symptoms.   PLAN  Monsel's placed.  Follow up in 2.5 weeks, sooner as needed.  Discussed Estroven.  Wishes to try this before doing a prescription for any estrogens.    An After Visit Summary was printed and given to the patient.

## 2016-03-04 ENCOUNTER — Telehealth: Payer: Self-pay | Admitting: Obstetrics and Gynecology

## 2016-03-04 NOTE — Telephone Encounter (Signed)
Patient would like to speak with you about her short term disability paperwork.

## 2016-03-19 ENCOUNTER — Encounter: Payer: Self-pay | Admitting: Obstetrics and Gynecology

## 2016-03-19 ENCOUNTER — Ambulatory Visit (INDEPENDENT_AMBULATORY_CARE_PROVIDER_SITE_OTHER): Payer: PRIVATE HEALTH INSURANCE | Admitting: Obstetrics and Gynecology

## 2016-03-19 VITALS — BP 140/82 | HR 80 | Ht 64.0 in | Wt 164.0 lb

## 2016-03-19 DIAGNOSIS — Z9889 Other specified postprocedural states: Secondary | ICD-10-CM

## 2016-03-19 NOTE — Progress Notes (Signed)
GYNECOLOGY  VISIT   HPI: 50 y.o.   Married  [place] American  female   [phone] with Patient's last menstrual period was 01/11/2016.   here for 5 week follow up [place] (Bilateral Abdomen) [id number] CPT (R)] CYSTOSCOPY (N/A Urethra).  02/10/16.  Daughter present for the entire visit today.   Patient being followed for vaginal cuff bleeding following procedure.  No cellulitis or dehiscence have been noted.  Bleeding has now stopped.   Out of work for 6 weeks.  No heavy lifting at work.   Hot flashes are manageable.  Interested in herbal options.  Has not purchased yet.     GYNECOLOGIC HISTORY: Patient's last menstrual period was 01/11/2016. Contraception: Tubal/Hysterectomy Menopausal hormone therapy:  None Last mammogram:  06-13-15 Density D/Neg/BiR[facility] Last pap smear:  02-12-14 Neg         OB History    Gravida Para Term Preterm AB Living   2 2 2     2    SAB TAB Ectopic Multiple Live Births                     Patient Active Problem List   Diagnosis Date Noted  . Status post laparoscopic hysterectomy 02/10/2016  . Absolute anemia 09/08/2015  . Essential hypertension 07/12/2014  . Tension headache 07/12/2014    Past Medical History:  Diagnosis Date  . Abnormal uterine bleeding   . Anemia   . Frequent headaches   . Hyperlipidemia   . Hypertension   . Migraine   . Seizure [facility])    accompany migraines    Past Surgical History:  Procedure Laterality Date  . CESAREAN SECTION  1988  . CYSTOSCOPY N/A 02/10/2016   Procedure: CYSTOSCOPY;  Surgeon: Nunzio Cobbs, MD;  Location: [facility];  Service: Gynecology;  Laterality: N/A;  . ROBOTIC ASSISTED TOTAL HYSTERECTOMY WITH BILATERAL SALPINGO OOPHERECTOMY Bilateral 02/10/2016   Procedure: ROBOTIC ASSISTED TOTAL HYSTERECTOMY WITH BILATERAL SALPINGO OOPHORECTOMY AND COLLECTION OF PELVIC WASHINGS;   Surgeon: Nunzio Cobbs, MD;  Location: [facility];  Service: Gynecology;  Laterality: Bilateral;  . TUBAL LIGATION      Current Outpatient Prescriptions  Medication Sig Dispense Refill  . ferrous sulfate 325 (65 FE) MG tablet Take 325 mg by mouth daily with breakfast.    . hydrochlorothiazide (MICROZIDE) 12.5 MG capsule Take 1 capsule (12.5 mg total) by mouth daily. 90 capsule 3  . ibuprofen (ADVIL,MOTRIN) 600 MG tablet Take 1 tablet (600 mg total) by mouth every 6 (six) hours as needed (mild pain). 30 tablet 0   No current facility-administered medications for this visit.      ALLERGIES: Review of patient's allergies indicates no known allergies.  Family History  Problem Relation Age of Onset  . Diabetes Mother   . Hypertension Mother     Social History   Social History  . Marital status: Married    Spouse name: N/A  . Number of children: N/A  . Years of education: N/A   Occupational History  . Not on file.   Social History Main Topics  . Smoking status: Never Smoker  . Smokeless tobacco: Never Used  . Alcohol use No  . Drug use: No  . Sexual activity: Yes    Partners: Male    Birth control/ protection: Surgical     Comment: Tubal   Other Topics Concern  . Not on file  Social History Narrative   Updated 08/2015   Work or School: production - boarding [employer]      Home Situation: Lives with husband - feels safe at home      Spiritual Beliefs: Christian      Lifestyle: no regular aerobic exercise, diet so so          ROS:  Pertinent items are noted in HPI.  PHYSICAL EXAMINATION:    BP 140/82 (BP Location: Right Arm, Patient Position: Sitting, Cuff Size: Normal)   Pulse 80   Ht 5\' 4"  (1.626 m)   Wt 164 lb (74.4 kg)   LMP 01/11/2016   BMI 28.15 kg/m     General appearance: alert, cooperative and appears stated age   Abdomen: incisions intact.  Suture removed from right lower quadrant incision.  Abdomen is soft, non-tender, no masses,  no  organomegaly   Pelvic: External genitalia:  no lesions              Urethra:  normal appearing urethra with no masses, tenderness or lesions              Bartholins and Skenes: normal                 Vagina:  Cuff intact.  No lesions.  Mild yellow discharge.              Cervix:  absent                Bimanual Exam:  Uterus:  normal size, contour, position, consistency, mobility, non-tender              Adnexa: no mass, fullness, tenderness             Chaperone was present for exam.  ASSESSMENT  Status post robotic TLH/BSO. Doing well post op. Menopausal symptoms.    PLAN  Ok to return to work 6 weeks post op.  Call if desires prescription tx for menopausal symptoms.  Try [facility] for now.  Annual exam in July 2018 and return prn.      An After Visit Summary was printed and given to the patient.

## 2016-03-31 NOTE — Telephone Encounter (Signed)
Forms have been completed and faxed to patients employer, [employer] with [employer] at fax number [phone]. Called employer and spoke with Ms Luana Shu and she confirmed forms received

## 2016-03-31 NOTE — Telephone Encounter (Signed)
Patient called again to check on the status of her forms. She had surgery 02/10/16 but her employer did not receive her short-term disability forms.

## 2016-06-29 ENCOUNTER — Telehealth: Payer: Self-pay | Admitting: Obstetrics and Gynecology

## 2016-06-29 NOTE — Telephone Encounter (Signed)
Patient had surgery a few months ago and has started seeing blood that she is concerned about.

## 2016-06-29 NOTE — Telephone Encounter (Signed)
Patient had a ROBOTIC ASSISTED TOTAL HYSTERECTOMY WITH BILATERAL SALPINGO OOPHORECTOMY AND COLLECTION OF PELVIC WASHINGS (Bilateral Abdomen) [id number] CPT (R)] CYSTOSCOPY (N/A Urethra) on 02/10/16.   Left message to call W[facility] at [phone].

## 2016-07-02 NOTE — Telephone Encounter (Signed)
Left message to call Tamas Suen at [phone]. 

## 2016-07-12 NOTE — Telephone Encounter (Signed)
Dr.Silva, I have attempted to reach this patient x 2 with no return call. Please advise next steps. 

## 2016-07-13 NOTE — Telephone Encounter (Signed)
I have read the phone call note, and I have closed the encounter.

## 2016-09-05 IMAGING — MG MM DIGITAL SCREENING BILAT W/ CAD
4 series · 4 of 4 positions shown · non-contrast
Comparison: Previous exam(s).

CLINICAL DATA: Screening.

EXAM:
DIGITAL SCREENING BILATERAL MAMMOGRAM WITH CAD

[R CC]
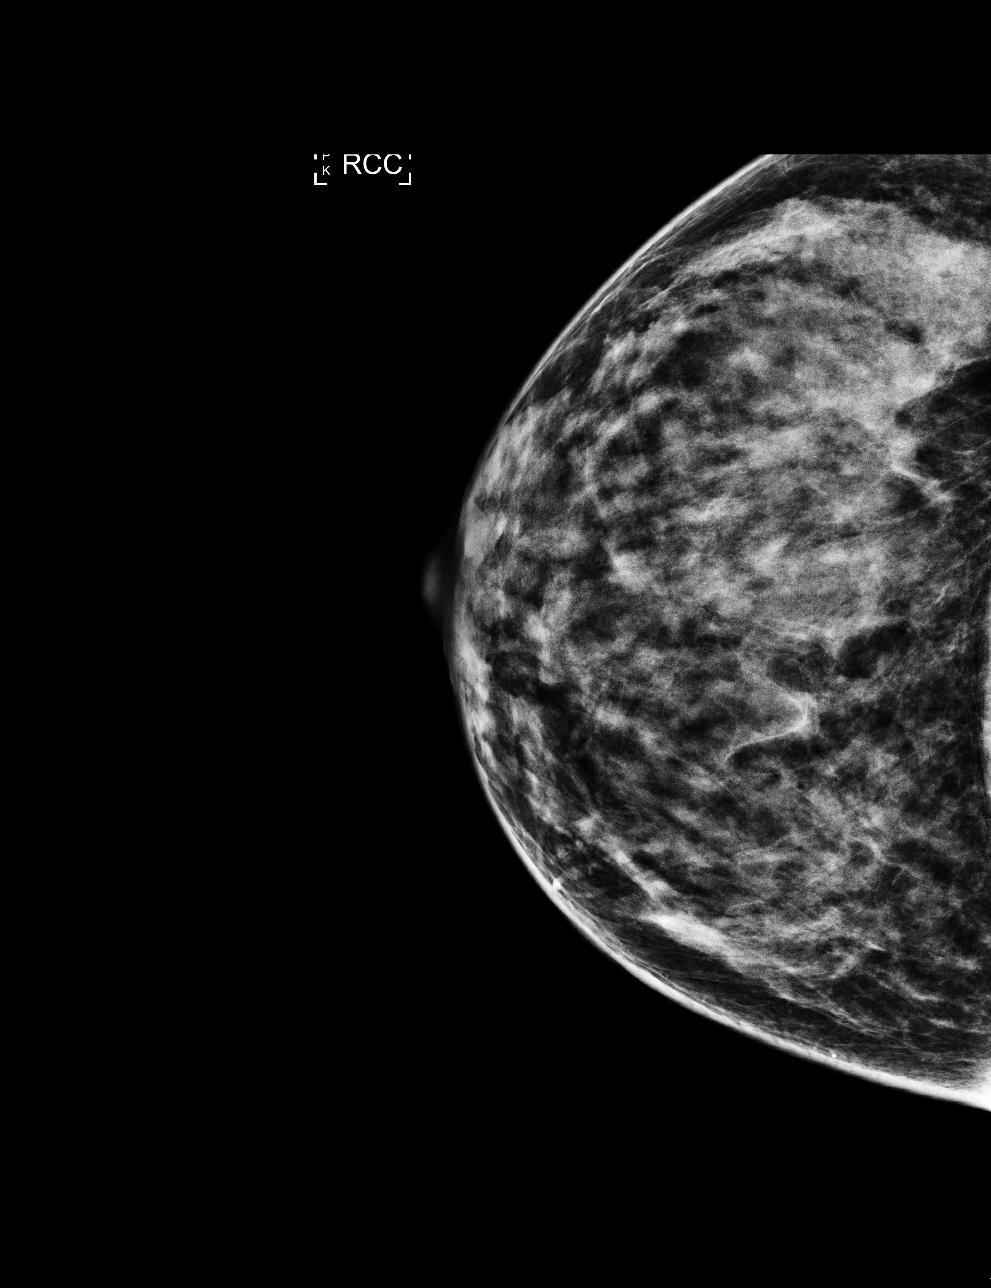

[L MLO]
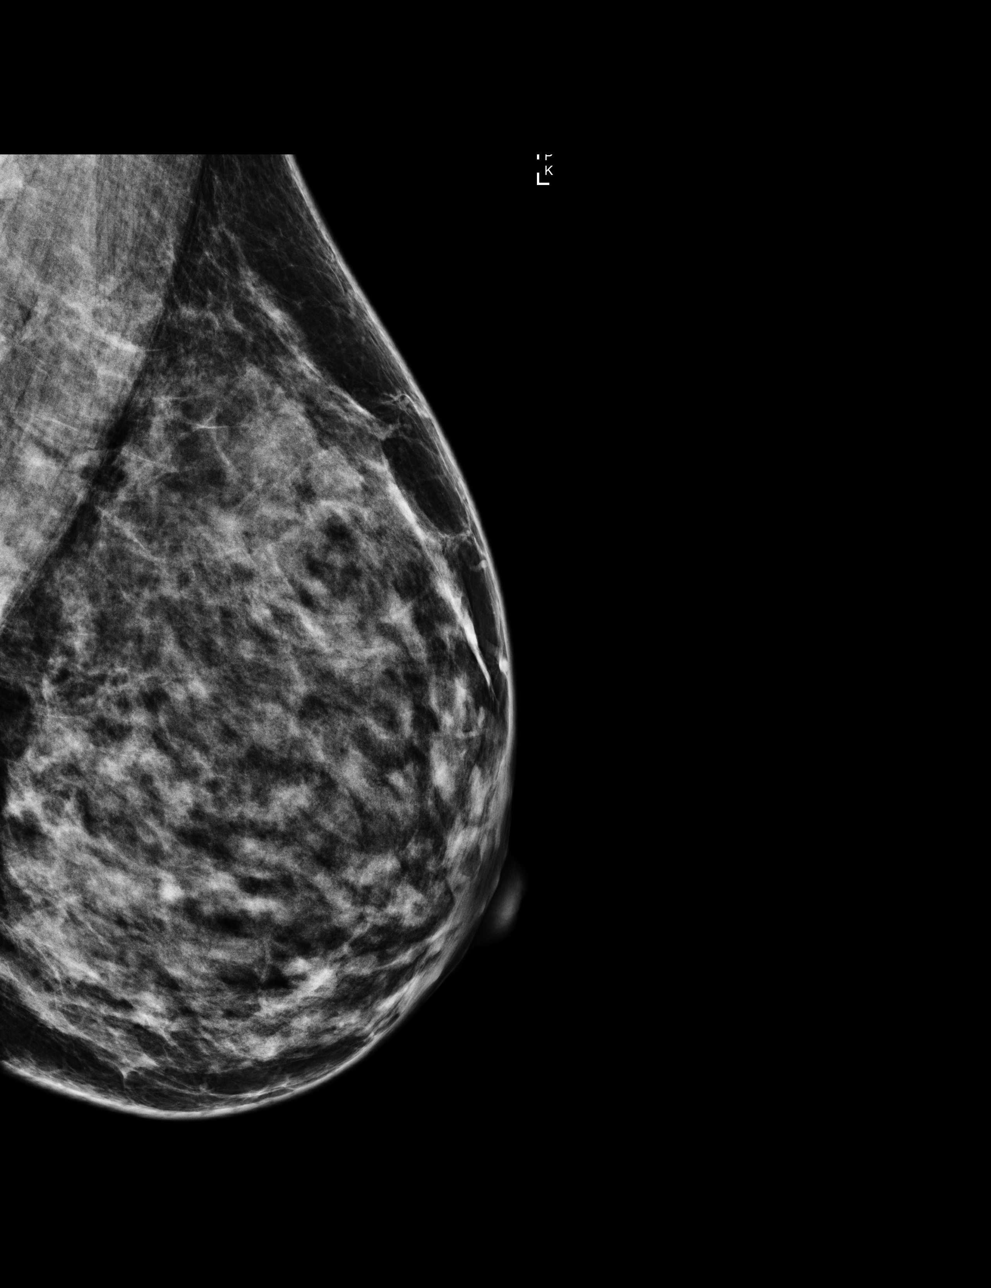

[R MLO]
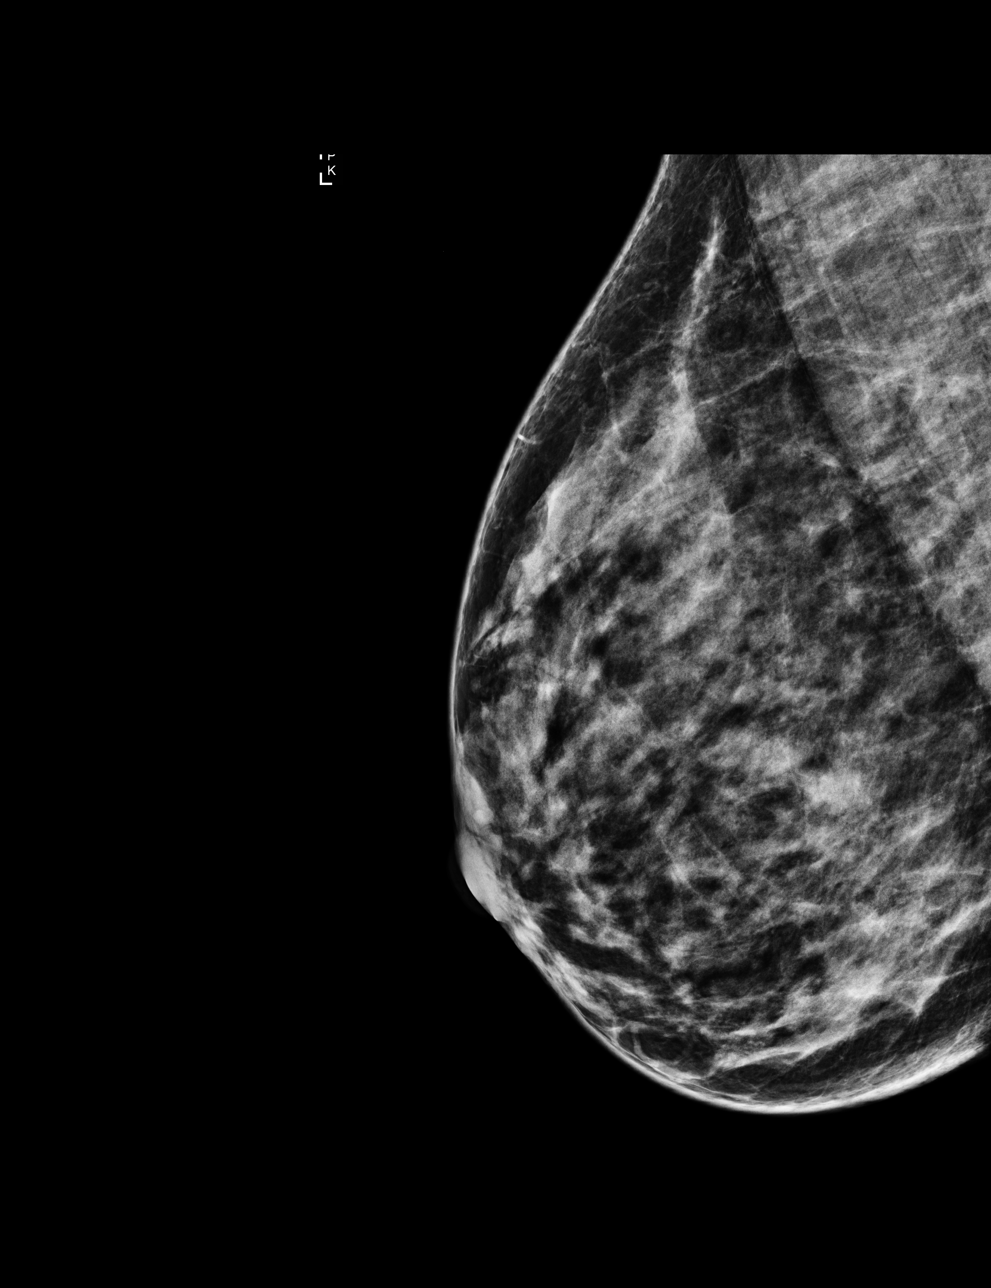

[L CC]
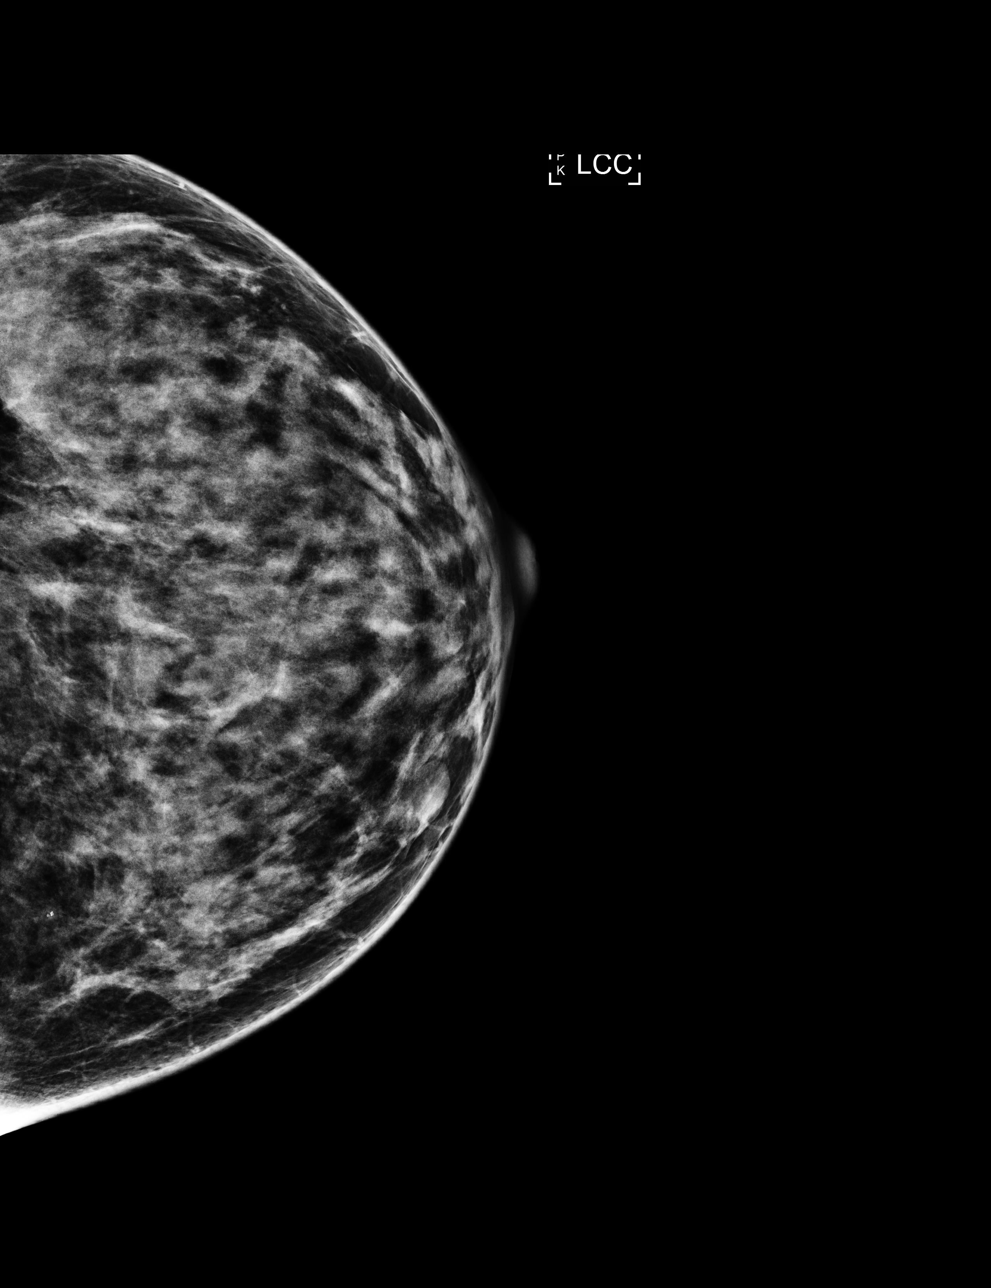

[4 of 4 positions shown; findings below may reference images not displayed]

ACR Breast Density Category d: The breast tissue is extremely dense,
which lowers the sensitivity of mammography.
FINDINGS: There are no findings suspicious for malignancy. Images were
processed with CAD.
IMPRESSION: No mammographic evidence of malignancy. A result letter of this
screening mammogram will be mailed directly to the patient.

RECOMMENDATION:
Screening mammogram in one year. (Code:BD-D-K0F)

BI-RADS CATEGORY  1: Negative.

## 2016-09-19 ENCOUNTER — Other Ambulatory Visit: Payer: Self-pay | Admitting: Family Medicine

## 2016-09-23 NOTE — Progress Notes (Signed)
HPI:  Kimberly Walker is a pleasant 51 y.o. here for follow up. I saw her once about a year ago when she established care, then she did not follow up. Chronic medical problems summarized below were reviewed for changes and stability and were updated as needed below. She reports she feels much better since her hysterectomy. She continues her blood pressure medication. She reports she does get regular exercise and does try to eat a healthy diet. She occasionally has some tingling in the thumb and first finger of her hands mainly at night. Denies CP, SOB, DOE, treatment intolerance or new symptoms. She is due for colon cancer screening, labs, hepatitis C screening and influenza vaccine. She refused vaccines and declined colon cancer screening at her last visit. She agrees to do a colonoscopy today. She prefers that over other options for colon cancer screening.  Hypertension: -chronic -meds: Hydrochlorthiazide 12.5 mg daily  Anemia: -per prior PCP notes was to take iron for this -on labs as far back as 2009, normocytic  Hx migraines and frequent headaches: -chronic -stable -uses OTC medications rarely  Abnormal uterine bleeding: -She thankfully, did see the gynecologist after her visit here and has had an extensive evaluation resulting in a total hysterectomy with bilateral salpingo-oophorectomy in 2017   ROS: See pertinent positives and negatives per HPI.  Past Medical History:  Diagnosis Date  . Abnormal uterine bleeding   . Anemia   . Frequent headaches   . Hyperlipidemia   . Hypertension   . Migraine   . Seizure [facility])    accompany migraines    Past Surgical History:  Procedure Laterality Date  . CESAREAN SECTION  1988  . CYSTOSCOPY N/A 02/10/2016   Procedure: CYSTOSCOPY;  Surgeon: Nunzio Cobbs, MD;  Location: [facility];  Service: Gynecology;  Laterality: N/A;  . ROBOTIC ASSISTED TOTAL HYSTERECTOMY WITH BILATERAL SALPINGO OOPHERECTOMY Bilateral 02/10/2016   Procedure: ROBOTIC ASSISTED TOTAL HYSTERECTOMY WITH BILATERAL SALPINGO OOPHORECTOMY AND COLLECTION OF PELVIC WASHINGS;  Surgeon: Nunzio Cobbs, MD;  Location: [facility];  Service: Gynecology;  Laterality: Bilateral;  . TUBAL LIGATION      Family History  Problem Relation Age of Onset  . Diabetes Mother   . Hypertension Mother     Social History   Social History  . Marital status: Married    Spouse name: N/A  . Number of children: N/A  . Years of education: N/A   Social History Main Topics  . Smoking status: Never Smoker  . Smokeless tobacco: Never Used  . Alcohol use No  . Drug use: No  . Sexual activity: Yes    Partners: Male    Birth control/ protection: Surgical     Comment: Tubal   Other Topics Concern  . None   Social History Narrative   Updated 08/2015   Work or School: production - boarding [employer]      Home Situation: Lives with husband - feels safe at home      Spiritual Beliefs: Christian      Lifestyle: no regular aerobic exercise, diet so so           Current Outpatient Prescriptions:  .  ferrous sulfate 325 (65 FE) MG tablet, Take 325 mg by mouth daily with breakfast., Disp: , Rfl:  .  ibuprofen (ADVIL,MOTRIN) 200 MG tablet, Take 200 mg by mouth as needed., Disp: , Rfl:  .  hydrochlorothiazide (HYDRODIURIL) 25 MG tablet, Take 1 tablet (25 mg total) by  mouth daily., Disp: 90 tablet, Rfl: 3  EXAM:  Vitals:   09/24/16 1054  BP: 128/84  Pulse: 88  Temp: 98.4 F (36.9 C)    Body mass index is 29.06 kg/m.  GENERAL: vitals reviewed and listed above, alert, oriented, appears well hydrated and in no acute distress  HEENT: atraumatic, conjunttiva clear, no obvious abnormalities on inspection of external nose and ears  NECK: no obvious masses on inspection  LUNGS: clear to auscultation bilaterally, no wheezes, rales or rhonchi, good air movement  CV: HRRR, no peripheral edema  MS: moves all extremities without noticeable  abnormality, normal inspection of the hands, negative exam today  PSYCH: pleasant and cooperative, no obvious depression or anxiety  ASSESSMENT AND PLAN:  Discussed the following assessment and plan:  Essential hypertension - Plan: Basic metabolic panel, CBC  Anemia, unspecified type  Paresthesia - Plan: Vitamin B12, TSH, Hemoglobin A1c  BMI 29.0-29.9,adult  Colon cancer screening - Plan: Ambulatory referral to Gastroenterology  -Suspect carpal tunnel syndrome of the hand and recommended a well gait for women perfect fit wrist support that she can order on [employer] and wear at night -We will check a thyroid and B12 for the paresthesias as well -Labs today, lifestyle recs -BP up on arrival, much better on recheck, but will increase hctz to 25mg  daily, new rx sent -colon cancer screening advised, she agreed to colonoscopy and prefers to other options, referral sent -Patient advised to return or notify a doctor immediately if symptoms worsen or persist or new concerns arise.  Patient Instructions  BEFORE YOU LEAVE: -follow up: CPE in 3-4 months -labs -? Flu shot  -We placed a referral for you as discussed for the colon cancer screening. It usually takes about 1-2 weeks to process and schedule this referral. If you have not heard from [place] regarding this appointment in 2 weeks please contact our office.  Wellga by et for women perfect fit wrist support  Increase the hydrochlorthiazide to 25 mg daily - I sent a new prescription to the pharmacy.  We have ordered labs or studies at this visit. It can take up to 1-2 weeks for results and processing. IF results require follow up or explanation, we will call you with instructions. Clinically stable results will be released to your [facility]. If you have not heard from [place] or cannot find your results in [facility] in 2 weeks please contact our office at [phone].  If you are not yet signed up for [facility], please consider signing up.   We  recommend the following healthy lifestyle for LIFE: 1) Small portions.   Tip: eat off of a salad plate instead of a dinner plate.  Tip: if you need more or a snack choose fruits, veggies and/or a handful of nuts or seeds.  2) Eat a healthy clean diet.  * Tip: Avoid (less then 1 serving per week): processed foods, sweets, sweetened drinks, white starches (rice, flour, bread, potatoes, pasta, etc), red meat, fast foods, butter  *Tip: CHOOSE instead   * 5-9 servings per day of fresh or frozen fruits and vegetables (but not corn, potatoes, bananas, canned or dried fruit)   *nuts and seeds, beans   *olives and olive oil   *small portions of lean meats such as fish and white chicken    *small portions of whole grains  3)Get at least 150 minutes of sweaty aerobic exercise per week.  4)Reduce stress - consider counseling, meditation and relaxation to balance other aspects of your  life.  **Coming March 12th**  [facility]!!!  Same Day Appointments for Acute Care: Sprains, Injuries, cuts, abrasions Colds, flu, sore throats, cough, upset stomachs Fever, ear pain Sinus and eye infections Animal/insect bites  3 Easy Ways to Schedule: Walk-In Scheduling Call in scheduling Mychart Sign-up: [email or web address]                Colin Benton R., DO

## 2016-09-24 ENCOUNTER — Ambulatory Visit (INDEPENDENT_AMBULATORY_CARE_PROVIDER_SITE_OTHER): Payer: BLUE CROSS/BLUE SHIELD | Admitting: Family Medicine

## 2016-09-24 ENCOUNTER — Encounter: Payer: Self-pay | Admitting: Family Medicine

## 2016-09-24 VITALS — BP 128/84 | HR 88 | Temp 98.4°F | Ht 64.0 in | Wt 169.3 lb

## 2016-09-24 DIAGNOSIS — D649 Anemia, unspecified: Secondary | ICD-10-CM | POA: Diagnosis not present

## 2016-09-24 DIAGNOSIS — Z1211 Encounter for screening for malignant neoplasm of colon: Secondary | ICD-10-CM

## 2016-09-24 DIAGNOSIS — I10 Essential (primary) hypertension: Secondary | ICD-10-CM

## 2016-09-24 DIAGNOSIS — Z6829 Body mass index (BMI) 29.0-29.9, adult: Secondary | ICD-10-CM

## 2016-09-24 DIAGNOSIS — R202 Paresthesia of skin: Secondary | ICD-10-CM | POA: Diagnosis not present

## 2016-09-24 LAB — CBC
HEMATOCRIT: 34.4 % — AB (ref 36.0–46.0)
HEMOGLOBIN: 11.6 g/dL — AB (ref 12.0–15.0)
MCHC: 33.6 g/dL (ref 30.0–36.0)
MCV: 91.3 fl (ref 78.0–100.0)
Platelets: 313 10*3/uL (ref 150.0–400.0)
RBC: 3.77 Mil/uL — ABNORMAL LOW (ref 3.87–5.11)
RDW: 13.1 % (ref 11.5–15.5)
WBC: 5.9 10*3/uL (ref 4.0–10.5)

## 2016-09-24 LAB — BASIC METABOLIC PANEL
BUN: 18 mg/dL (ref 6–23)
CHLORIDE: 102 meq/L (ref 96–112)
CO2: 33 mEq/L — ABNORMAL HIGH (ref 19–32)
Calcium: 10 mg/dL (ref 8.4–10.5)
Creatinine, Ser: 0.92 mg/dL (ref 0.40–1.20)
GFR: 82.29 mL/min (ref >60.00)
Glucose, Bld: 94 mg/dL (ref 70–99)
POTASSIUM: 4.3 meq/L (ref 3.5–5.1)
SODIUM: 140 meq/L (ref 135–145)

## 2016-09-24 LAB — TSH: TSH: 1.76 u[IU]/mL (ref 0.35–4.50)

## 2016-09-24 LAB — HEMOGLOBIN A1C: HEMOGLOBIN A1C: 5.8 % (ref 4.6–6.5)

## 2016-09-24 LAB — VITAMIN B12: Vitamin B-12: 663 pg/mL (ref 211–911)

## 2016-09-24 MED ORDER — HYDROCHLOROTHIAZIDE 25 MG PO TABS: 25.0000 mg | ORAL_TABLET | Freq: Every day | ORAL | 3 refills | Status: DC

## 2016-09-24 NOTE — Patient Instructions (Addendum)
BEFORE YOU LEAVE: -follow up: CPE in 3-4 months -labs -? Flu shot  -We placed a referral for you as discussed for the colon cancer screening. It usually takes about 1-2 weeks to process and schedule this referral. If you have not heard from [place] regarding this appointment in 2 weeks please contact our office.  Wellga by et for women perfect fit wrist support  Increase the hydrochlorthiazide to 25 mg daily - I sent a new prescription to the pharmacy.  We have ordered labs or studies at this visit. It can take up to 1-2 weeks for results and processing. IF results require follow up or explanation, we will call you with instructions. Clinically stable results will be released to your [facility]. If you have not heard from [place] or cannot find your results in [facility] in 2 weeks please contact our office at [phone].  If you are not yet signed up for [facility], please consider signing up.   We recommend the following healthy lifestyle for LIFE: 1) Small portions.   Tip: eat off of a salad plate instead of a dinner plate.  Tip: if you need more or a snack choose fruits, veggies and/or a handful of nuts or seeds.  2) Eat a healthy clean diet.  * Tip: Avoid (less then 1 serving per week): processed foods, sweets, sweetened drinks, white starches (rice, flour, bread, potatoes, pasta, etc), red meat, fast foods, butter  *Tip: CHOOSE instead   * 5-9 servings per day of fresh or frozen fruits and vegetables (but not corn, potatoes, bananas, canned or dried fruit)   *nuts and seeds, beans   *olives and olive oil   *small portions of lean meats such as fish and white chicken    *small portions of whole grains  3)Get at least 150 minutes of sweaty aerobic exercise per week.  4)Reduce stress - consider counseling, meditation and relaxation to balance other aspects of your life.  **Coming March 12th**  [facility]!!!  Same Day Appointments for Acute Care: Sprains, Injuries, cuts,  abrasions Colds, flu, sore throats, cough, upset stomachs Fever, ear pain Sinus and eye infections Animal/insect bites  3 Easy Ways to Schedule: Walk-In Scheduling Call in scheduling Mychart Sign-up: [email or web address]

## 2016-09-24 NOTE — Progress Notes (Signed)
Pre visit review using our clinic review tool, if applicable. No additional management support is needed unless otherwise documented below in the visit note. 

## 2016-09-29 ENCOUNTER — Encounter: Payer: Self-pay | Admitting: Gastroenterology

## 2016-11-05 NOTE — Progress Notes (Signed)
Left message for patient to call us regarding her seizure activity with migraines. The last episode was unable to be accessed.

## 2016-11-19 ENCOUNTER — Encounter: Payer: Self-pay | Admitting: Gastroenterology

## 2016-11-19 ENCOUNTER — Ambulatory Visit (AMBULATORY_SURGERY_CENTER): Payer: Self-pay | Admitting: *Deleted

## 2016-11-19 VITALS — Ht 65.0 in | Wt 163.0 lb

## 2016-11-19 DIAGNOSIS — Z1211 Encounter for screening for malignant neoplasm of colon: Secondary | ICD-10-CM

## 2016-11-19 MED ORDER — NA SULFATE-K SULFATE-MG SULF 17.5-3.13-1.6 GM/177ML PO SOLN: ORAL | 0 refills | Status: DC

## 2016-11-19 NOTE — Progress Notes (Signed)
No allergies to eggs or soy. No problems with anesthesia.  Pt given Emmi instructions for colonoscopy  No oxygen use  No diet drug use  

## 2016-11-29 ENCOUNTER — Telehealth: Payer: Self-pay | Admitting: Gastroenterology

## 2016-11-30 NOTE — Telephone Encounter (Signed)
Rebate card faxed to [facility] pharmacy and instructed to contact pt when ready.

## 2016-12-03 ENCOUNTER — Encounter: Payer: Self-pay | Admitting: Gastroenterology

## 2016-12-03 ENCOUNTER — Ambulatory Visit (AMBULATORY_SURGERY_CENTER): Payer: BLUE CROSS/BLUE SHIELD | Admitting: Gastroenterology

## 2016-12-03 VITALS — BP 105/61 | HR 66 | Temp 97.8°F | Resp 13 | Ht 65.0 in | Wt 163.0 lb

## 2016-12-03 DIAGNOSIS — Z1211 Encounter for screening for malignant neoplasm of colon: Secondary | ICD-10-CM

## 2016-12-03 DIAGNOSIS — Z538 Procedure and treatment not carried out for other reasons: Secondary | ICD-10-CM

## 2016-12-03 DIAGNOSIS — Z1212 Encounter for screening for malignant neoplasm of rectum: Secondary | ICD-10-CM

## 2016-12-03 MED ORDER — SODIUM CHLORIDE 0.9 % IV SOLN: 500.0000 mL | INTRAVENOUS | Status: AC

## 2016-12-03 NOTE — Progress Notes (Signed)
Pt's states no medical or surgical changes since previsit or office visit. 

## 2016-12-03 NOTE — Op Note (Signed)
[facility] Patient Name: Jullie Arps Procedure Date: 12/03/2016 10:52 AM MRN: [id number] Endoscopist: Remo Lipps P. Danamarie Minami MD, MD Age: 51 Referring MD:  Date of Birth: August 12, 1965 Gender: Female Account #: [id number] Procedure:                Colonoscopy Indications:              Screening for colorectal malignant neoplasm, This                            is the patient's first colonoscopy Medicines:                Monitored Anesthesia Care Procedure:                Pre-Anesthesia Assessment:                           - Prior to the procedure, a History and Physical                            was performed, and patient medications and                            allergies were reviewed. The patient's tolerance of                            previous anesthesia was also reviewed. The risks                            and benefits of the procedure and the sedation                            options and risks were discussed with the patient.                            All questions were answered, and informed consent                            was obtained. Prior Anticoagulants: The patient has                            taken no previous anticoagulant or antiplatelet                            agents. ASA Grade Assessment: II - A patient with                            mild systemic disease. After reviewing the risks                            and benefits, the patient was deemed in                            satisfactory condition to undergo the procedure.  After obtaining informed consent, the colonoscope                            was passed under direct vision. Throughout the                            procedure, the patient's blood pressure, pulse, and                            oxygen saturations were monitored continuously. The                            Colonoscope was introduced through the anus and                            advanced to the the  cecum, identified by                            appendiceal orifice and ileocecal valve. The                            colonoscopy was performed without difficulty. The                            patient tolerated the procedure well. The quality                            of the bowel preparation was inadequate. The                            ileocecal valve, appendiceal orifice, and rectum                            were photographed. Scope In: 10:56:33 AM Scope Out: 11:06:27 AM Scope Withdrawal Time: 0 hours 3 minutes 10 seconds  Total Procedure Duration: 0 hours 9 minutes 54 seconds  Findings:                 The perianal and digital rectal examinations were                            normal.                           A large amount of semi-liquid stool was found in                            the entire colon, interfering with visualization.                            Lavage was performed using copious amounts of                            sterile water, resulting in incomplete clearance  with continued poor visualization, as the endoscope                            continued to clog with particles of stool,                            prohibiting complete clearance.                           No obvious large mass lesions or polyps were noted,                            but the exam was limited by poor prep and                            inadequate for screening purposes. Complications:            No immediate complications. Estimated blood loss:                            None. Estimated Blood Loss:     Estimated blood loss: none. Impression:               - Preparation of the colon was inadequate for                            screening purposes.                           - Stool in the entire examined colon.                           - No obvious mass lesions or large polyps Recommendation:           - Patient has a contact number available for                             emergencies. The signs and symptoms of potential                            delayed complications were discussed with the                            patient. Return to normal activities tomorrow.                            Written discharge instructions were provided to the                            patient.                           - Resume previous diet.                           - Continue present medications.                           -  Repeat colonoscopy because the bowel preparation                            was suboptimal. Recommend 2 day bowel preparation                            for next exam Larance Ratledge P. Adanely Reynoso MD, MD 12/03/2016 11:11:27 AM This report has been signed electronically.

## 2016-12-03 NOTE — Progress Notes (Signed)
A/ox3 pleased with MAC, report to [facility]

## 2016-12-03 NOTE — Patient Instructions (Signed)
Discharge instructions given. Incomplete exam. Patient will call back to reschedule Colonoscopy. Concerns about insurance not covering. Resume previous medications. YOU HAD AN ENDOSCOPIC PROCEDURE TODAY AT [facility]:   Refer to the procedure report that was given to you for any specific questions about what was found during the examination.  If the procedure report does not answer your questions, please call your gastroenterologist to clarify.  If you requested that your care partner not be given the details of your procedure findings, then the procedure report has been included in a sealed envelope for you to review at your convenience later.  YOU SHOULD EXPECT: Some feelings of bloating in the abdomen. Passage of more gas than usual.  Walking can help get rid of the air that was put into your GI tract during the procedure and reduce the bloating. If you had a lower endoscopy (such as a colonoscopy or flexible sigmoidoscopy) you may notice spotting of blood in your stool or on the toilet paper. If you underwent a bowel prep for your procedure, you may not have a normal bowel movement for a few days.  Please Note:  You might notice some irritation and congestion in your nose or some drainage.  This is from the oxygen used during your procedure.  There is no need for concern and it should clear up in a day or so.  SYMPTOMS TO REPORT IMMEDIATELY:   Following lower endoscopy (colonoscopy or flexible sigmoidoscopy):  Excessive amounts of blood in the stool  Significant tenderness or worsening of abdominal pains  Swelling of the abdomen that is new, acute  Fever of 100F or higher   For urgent or emergent issues, a gastroenterologist can be reached at any hour by calling [phone].   DIET:  We do recommend a small meal at first, but then you may proceed to your regular diet.  Drink plenty of fluids but you should avoid alcoholic beverages for 24 hours.  ACTIVITY:  You  should plan to take it easy for the rest of today and you should NOT DRIVE or use heavy machinery until tomorrow (because of the sedation medicines used during the test).    FOLLOW UP: Our staff will call the number listed on your records the next business day following your procedure to check on you and address any questions or concerns that you may have regarding the information given to you following your procedure. If we do not reach you, we will leave a message.  However, if you are feeling well and you are not experiencing any problems, there is no need to return our call.  We will assume that you have returned to your regular daily activities without incident.  If any biopsies were taken you will be contacted by phone or by letter within the next 1-3 weeks.  Please call us at [phone] if you have not heard about the biopsies in 3 weeks.    SIGNATURES/CONFIDENTIALITY: You and/or your care partner have signed paperwork which will be entered into your electronic medical record.  These signatures attest to the fact that that the information above on your After Visit Summary has been reviewed and is understood.  Full responsibility of the confidentiality of this discharge information lies with you and/or your care-partner.

## 2016-12-06 ENCOUNTER — Telehealth: Payer: Self-pay

## 2016-12-06 NOTE — Telephone Encounter (Signed)
Received message from endoscopy report, patient will call back to reschedule repeat colonoscopy, she is concerned about insurance coverage.

## 2016-12-06 NOTE — Telephone Encounter (Signed)
Left message on answering machine. 

## 2016-12-06 NOTE — Telephone Encounter (Signed)
Attempted to reach pt. With follow up call following endoscopic procedure.   LM on pt. Ans. Machine.

## 2016-12-07 NOTE — Telephone Encounter (Signed)
Pt's daughter Amy Minette Brine calling [facility] back states she has other questions regarding scheduling repeat colonoscopy for patient. Best call back # is [phone].

## 2016-12-07 NOTE — Telephone Encounter (Signed)
Advised daughter to contact customer service for her [employer] insurance to see if they would cover the colonoscopy again due to poor visualization and inadequate prep.

## 2016-12-21 ENCOUNTER — Encounter: Payer: Self-pay | Admitting: Obstetrics & Gynecology

## 2016-12-31 ENCOUNTER — Encounter: Payer: BLUE CROSS/BLUE SHIELD | Admitting: Family Medicine

## 2016-12-31 ENCOUNTER — Encounter: Payer: Self-pay | Admitting: Family Medicine

## 2016-12-31 NOTE — Progress Notes (Deleted)
HPI:  Here for CPE: Due for repeat colon ca screening?, mammo, labs Has refused vaccines in the past  -Concerns and/or follow up today:   Hypertension: -chronic -meds: Hydrochlorthiazide 12.5 mg daily  Anemia: -per prior PCP notes was to take iron for this -on labs as far back as 2009, normocytic  Hx migraines and frequent headaches: -chronic -stable -uses OTC medications rarely  -Diet: variety of foods, balance and well rounded, larger portion sizes  -Exercise: no regular exercise  -Taking folic acid, vitamin D or calcium: no  -Diabetes and Dyslipidemia Screening:  -Hx of HTN: no  -Vaccines: UTD  -pap history: s/p complete hysterectomy for benign pathology  -FDLMP:  -sexual activity: yes, female partner, no new partners  -wants STI testing (Hep C if born [birth year]): no  -FH breast, colon or ovarian ca: see FH Last mammogram: 05/2015 birads 1 Last colon cancer screening: had colonosocpy in 11/2016 - but per report was inadequate prep  -Alcohol, Tobacco, drug use: see social history  Review of Systems - no fevers, unintentional weight loss, vision loss, hearing loss, chest pain, sob, hemoptysis, melena, hematochezia, hematuria, genital discharge, changing or concerning skin lesions, bleeding, bruising, loc, thoughts of self harm or SI  Past Medical History:  Diagnosis Date  . Abnormal uterine bleeding   . Anemia   . Frequent headaches   . Hyperlipidemia   . Hypertension   . Migraine   . Seizure [facility])    accompany migraines-last seizure 2011    Past Surgical History:  Procedure Laterality Date  . CESAREAN SECTION  1988  . CYSTOSCOPY N/A 02/10/2016   Procedure: CYSTOSCOPY;  Surgeon: Nunzio Cobbs, MD;  Location: [facility];  Service: Gynecology;  Laterality: N/A;  . ROBOTIC ASSISTED TOTAL HYSTERECTOMY WITH BILATERAL SALPINGO OOPHERECTOMY Bilateral 02/10/2016   Procedure: ROBOTIC ASSISTED TOTAL HYSTERECTOMY WITH BILATERAL SALPINGO OOPHORECTOMY AND  COLLECTION OF PELVIC WASHINGS;  Surgeon: Nunzio Cobbs, MD;  Location: [facility];  Service: Gynecology;  Laterality: Bilateral;  . TUBAL LIGATION      Family History  Problem Relation Age of Onset  . Diabetes Mother   . Hypertension Mother     Social History   Social History  . Marital status: Married    Spouse name: N/A  . Number of children: N/A  . Years of education: N/A   Social History Main Topics  . Smoking status: Never Smoker  . Smokeless tobacco: Never Used  . Alcohol use No  . Drug use: No  . Sexual activity: Yes    Partners: Male    Birth control/ protection: Surgical     Comment: Tubal   Other Topics Concern  . Not on file   Social History Narrative   Updated 08/2015   Work or School: production - boarding [employer]      Home Situation: Lives with husband - feels safe at home      Spiritual Beliefs: Christian      Lifestyle: no regular aerobic exercise, diet so so           Current Outpatient Prescriptions:  .  ferrous sulfate 325 (65 FE) MG tablet, Take 325 mg by mouth daily with breakfast., Disp: , Rfl:  .  hydrochlorothiazide (HYDRODIURIL) 25 MG tablet, Take 1 tablet (25 mg total) by mouth daily., Disp: 90 tablet, Rfl: 3  Current Facility-Administered Medications:  .  0.9 %  sodium chloride infusion, 500 mL, Intravenous, Continuous, Armbruster, Renelda Loma, MD  EXAM:  There  were no vitals filed for this visit.  GENERAL: vitals reviewed and listed below, alert, oriented, appears well hydrated and in no acute distress  HEENT: head atraumatic, PERRLA, normal appearance of eyes, ears, nose and mouth. moist mucus membranes.  NECK: supple, no masses or lymphadenopathy  LUNGS: clear to auscultation bilaterally, no rales, rhonchi or wheeze  CV: HRRR, no peripheral edema or cyanosis, normal pedal pulses  ABDOMEN: bowel sounds normal, soft, non tender to palpation, no masses, no rebound or guarding  SKIN: no rash or abnormal  lesions  MS: normal gait, moves all extremities normally  NEURO: normal gait, speech and thought processing grossly intact, muscle tone grossly intact throughout  PSYCH: normal affect, pleasant and cooperative  ASSESSMENT AND PLAN:  Discussed the following assessment and plan:  There are no diagnoses linked to this encounter.  -Discussed and advised all [place] preventive services health task force level A and B recommendations for age, sex and risks.  -Advised at least 150 minutes of exercise per week and a healthy diet with avoidance of (less then 1 serving per week) processed foods, white starches, red meat, fast foods and sweets and consisting of: * 5-9 servings of fresh fruits and vegetables (not corn or potatoes) *nuts and seeds, beans *olives and olive oil *lean meats such as fish and white chicken  *whole grains  -labs, studies and vaccines per orders this encounter  No orders of the defined types were placed in this encounter.   Patient advised to return to clinic immediately if symptoms worsen or persist or new concerns.  There are no Patient Instructions on file for this visit.  No Follow-up on file.  Colin Benton R., DO

## 2016-12-31 NOTE — Progress Notes (Signed)
Late cancel after appt.

## 2017-01-20 NOTE — Progress Notes (Signed)
HPI:  Here for CPE: -Concerns and/or follow up today:   Hand pain: -occ pain in R hand over 4th mcp region and experienced catching/locking of finger once -no fevers, malaise or redness  Hypertension: -chronic -meds: Hydrochlorthiazide 12.5 mg daily  Anemia: -per prior PCP notes was to take iron for this -on labs as far back as 2009, normocytic  Hx migraines and frequent headaches: -chronic -stable -uses OTC medications rarely  Abnormal uterine bleeding: -She thankfully, did see the gynecologist  - had an extensive evaluation resulting in a total hysterectomy with bilateral salpingo-oophorectomy in 2017  -Diet: variety of foods, balance and well rounded, larger portion sizes  -Exercise: no regular exercise  -Taking folic acid, vitamin D or calcium: no  -Diabetes and Dyslipidemia Screening: fasting for labs  -Hx of HTN: no  -Vaccines: UTD  -pap history: n/a  -sexual activity: yes, female partner, no new partners  -wants STI testing (Hep C if born [birth year]): no  -FH breast, colon or ovarian ca: see FH Last mammogram: utd 05/2016 per her report Last colon cancer screening: may 2018 per her report  -Alcohol, Tobacco, drug use: see social history  Review of Systems - no fevers, unintentional weight loss, vision loss, hearing loss, chest pain, sob, hemoptysis, melena, hematochezia, hematuria, genital discharge, changing or concerning skin lesions, bleeding, bruising, loc, thoughts of self harm or SI  Past Medical History:  Diagnosis Date  . Abnormal uterine bleeding   . Anemia   . Frequent headaches   . Hyperlipidemia   . Hypertension   . Migraine   . Seizure [facility])    accompany migraines-last seizure 2011    Past Surgical History:  Procedure Laterality Date  . CESAREAN SECTION  1988  . CYSTOSCOPY N/A 02/10/2016   Procedure: CYSTOSCOPY;  Surgeon: Nunzio Cobbs, MD;  Location: [place] [facility];  Service: Gynecology;  Laterality: N/A;  . ROBOTIC ASSISTED  TOTAL HYSTERECTOMY WITH BILATERAL SALPINGO OOPHERECTOMY Bilateral 02/10/2016   Procedure: ROBOTIC ASSISTED TOTAL HYSTERECTOMY WITH BILATERAL SALPINGO OOPHORECTOMY AND COLLECTION OF PELVIC WASHINGS;  Surgeon: Nunzio Cobbs, MD;  Location: [place] [facility];  Service: Gynecology;  Laterality: Bilateral;  . TUBAL LIGATION      Family History  Problem Relation Age of Onset  . Diabetes Mother   . Hypertension Mother     Social History   Social History  . Marital status: Married    Spouse name: N/A  . Number of children: N/A  . Years of education: N/A   Social History Main Topics  . Smoking status: Never Smoker  . Smokeless tobacco: Never Used  . Alcohol use No  . Drug use: No  . Sexual activity: Yes    Partners: Male    Birth control/ protection: Surgical     Comment: Tubal   Other Topics Concern  . None   Social History Narrative   Updated 08/2015   Work or School: production - boarding [employer]      Home Situation: Lives with husband - feels safe at home      Spiritual Beliefs: Christian      Lifestyle: no regular aerobic exercise, diet so so           Current Outpatient Prescriptions:  .  ferrous sulfate 325 (65 FE) MG tablet, Take 325 mg by mouth daily with breakfast., Disp: , Rfl:  .  hydrochlorothiazide (HYDRODIURIL) 25 MG tablet, Take 1 tablet (25 mg total) by mouth daily., Disp: 90 tablet, Rfl: 3  Current Facility-Administered Medications:  .  0.9 %  sodium chloride infusion, 500 mL, Intravenous, Continuous, Armbruster, Renelda Loma, MD  EXAM:  Vitals:   01/21/17 1059  BP: 120/78  Pulse: 68  Temp: 98.4 F (36.9 C)    GENERAL: vitals reviewed and listed below, alert, oriented, appears well hydrated and in no acute distress  HEENT: head atraumatic, PERRLA, normal appearance of eyes, ears, nose and mouth. moist mucus membranes.  NECK: supple, no masses or lymphadenopathy  LUNGS: clear to auscultation bilaterally, no rales, rhonchi or wheeze  CV:  HRRR, no peripheral edema or cyanosis, normal pedal pulses  ABDOMEN: bowel sounds normal, soft, non tender to palpation, no masses, no rebound or guarding  SKIN: no rash or abnormal lesions  MS: normal gait, moves all extremities normally  BREAST/GYN: declined  NEURO: normal gait, speech and thought processing grossly intact, muscle tone grossly intact throughout  PSYCH: normal affect, pleasant and cooperative  ASSESSMENT AND PLAN:  Discussed the following assessment and plan:  Encounter for preventive health examination - Plan: Lipid panel  Pain in both hands  Essential hypertension - Plan: Basic metabolic panel, CBC  Status post laparoscopic hysterectomy  -suspect tenosynovitis hand, discussed this and other potential etiologies and treatment options. She prefers to observe for now. Advised her to call if desires further eval.tx.  -Discussed and advised all [place] preventive services health task force level A and B recommendations for age, sex and risks.  -Advised at least 150 minutes of exercise per week and a healthy diet with avoidance of (less then 1 serving per week) processed foods, white starches, red meat, fast foods and sweets and consisting of: * 5-9 servings of fresh fruits and vegetables (not corn or potatoes) *nuts and seeds, beans *olives and olive oil *lean meats such as fish and white chicken  *whole grains  -labs, studies and vaccines per orders this encounter  Orders Placed This Encounter  Procedures  . Basic metabolic panel  . CBC  . Lipid panel    Patient advised to return to clinic immediately if symptoms worsen or persist or new concerns.  Patient Instructions  BEFORE YOU LEAVE: -labs -follow up: 3-4 months  We have ordered labs or studies at this visit. It can take up to 1-2 weeks for results and processing. IF results require follow up or explanation, we will call you with instructions. Clinically stable results will be released to your  [facility]. If you have not heard from [place] or cannot find your results in [facility] in 2 weeks please contact our office at [phone].  If you are not yet signed up for [facility], please consider signing up.  Let us know if you would like to see the sports medicine doctor about your hand.   Health Maintenance for Postmenopausal Women Menopause is a normal process in which your reproductive ability comes to an end. This process happens gradually over a span of months to years, usually between the ages of 17 and 4. Menopause is complete when you have missed 12 consecutive menstrual periods. It is important to talk with your health care provider about some of the most common conditions that affect postmenopausal women, such as heart disease, cancer, and bone loss (osteoporosis). Adopting a healthy lifestyle and getting preventive care can help to promote your health and wellness. Those actions can also lower your chances of developing some of these common conditions. What should I know about menopause? During menopause, you may experience a number of symptoms, such as:  Moderate-to-severe hot flashes.  Night sweats.  Decrease in sex drive.  Mood swings.  Headaches.  Tiredness.  Irritability.  Memory problems.  Insomnia.  Choosing to treat or not to treat menopausal changes is an individual decision that you make with your health care provider. What should I know about hormone replacement therapy and supplements? Hormone therapy products are effective for treating symptoms that are associated with menopause, such as hot flashes and night sweats. Hormone replacement carries certain risks, especially as you become older. If you are thinking about using estrogen or estrogen with progestin treatments, discuss the benefits and risks with your health care provider. What should I know about heart disease and stroke? Heart disease, heart attack, and stroke become more likely as you age. This may be  due, in part, to the hormonal changes that your body experiences during menopause. These can affect how your body processes dietary fats, triglycerides, and cholesterol. Heart attack and stroke are both medical emergencies. There are many things that you can do to help prevent heart disease and stroke:  Have your blood pressure checked at least every 1-2 years. High blood pressure causes heart disease and increases the risk of stroke.  If you are 37-12 years old, ask your health care provider if you should take aspirin to prevent a heart attack or a stroke.  Do not use any tobacco products, including cigarettes, chewing tobacco, or electronic cigarettes. If you need help quitting, ask your health care provider.  It is important to eat a healthy diet and maintain a healthy weight. ? Be sure to include plenty of vegetables, fruits, low-fat dairy products, and lean protein. ? Avoid eating foods that are high in solid fats, added sugars, or salt (sodium).  Get regular exercise. This is one of the most important things that you can do for your health. ? Try to exercise for at least 150 minutes each week. The type of exercise that you do should increase your heart rate and make you sweat. This is known as moderate-intensity exercise. ? Try to do strengthening exercises at least twice each week. Do these in addition to the moderate-intensity exercise.  Know your numbers.Ask your health care provider to check your cholesterol and your blood glucose. Continue to have your blood tested as directed by your health care provider.  What should I know about cancer screening? There are several types of cancer. Take the following steps to reduce your risk and to catch any cancer development as early as possible. Breast Cancer  Practice breast self-awareness. ? This means understanding how your breasts normally appear and feel. ? It also means doing regular breast self-exams. Let your health care provider  know about any changes, no matter how small.  If you are 6 or older, have a clinician do a breast exam (clinical breast exam or CBE) every year. Depending on your age, family history, and medical history, it may be recommended that you also have a yearly breast X-ray (mammogram).  If you have a family history of breast cancer, talk with your health care provider about genetic screening.  If you are at high risk for breast cancer, talk with your health care provider about having an MRI and a mammogram every year.  Breast cancer (BRCA) gene test is recommended for women who have family members with BRCA-related cancers. Results of the assessment will determine the need for genetic counseling and BRCA1 and for BRCA2 testing. BRCA-related cancers include these types: ? Breast. This occurs in  males or females. ? Ovarian. ? Tubal. This may also be called fallopian tube cancer. ? Cancer of the abdominal or pelvic lining (peritoneal cancer). ? Prostate. ? Pancreatic.  Cervical, Uterine, and Ovarian Cancer Your health care provider may recommend that you be screened regularly for cancer of the pelvic organs. These include your ovaries, uterus, and vagina. This screening involves a pelvic exam, which includes checking for microscopic changes to the surface of your cervix (Pap test).  For women ages 21-65, health care providers may recommend a pelvic exam and a Pap test every three years. For women ages 52-65, they may recommend the Pap test and pelvic exam, combined with testing for human papilloma virus (HPV), every five years. Some types of HPV increase your risk of cervical cancer. Testing for HPV may also be done on women of any age who have unclear Pap test results.  Other health care providers may not recommend any screening for nonpregnant women who are considered low risk for pelvic cancer and have no symptoms. Ask your health care provider if a screening pelvic exam is right for you.  If you  have had past treatment for cervical cancer or a condition that could lead to cancer, you need Pap tests and screening for cancer for at least 20 years after your treatment. If Pap tests have been discontinued for you, your risk factors (such as having a new sexual partner) need to be reassessed to determine if you should start having screenings again. Some women have medical problems that increase the chance of getting cervical cancer. In these cases, your health care provider may recommend that you have screening and Pap tests more often.  If you have a family history of uterine cancer or ovarian cancer, talk with your health care provider about genetic screening.  If you have vaginal bleeding after reaching menopause, tell your health care provider.  There are currently no reliable tests available to screen for ovarian cancer.  Lung Cancer Lung cancer screening is recommended for adults 67-5 years old who are at high risk for lung cancer because of a history of smoking. A yearly low-dose CT scan of the lungs is recommended if you:  Currently smoke.  Have a history of at least 30 pack-years of smoking and you currently smoke or have quit within the past 15 years. A pack-year is smoking an average of one pack of cigarettes per day for one year.  Yearly screening should:  Continue until it has been 15 years since you quit.  Stop if you develop a health problem that would prevent you from having lung cancer treatment.  Colorectal Cancer  This type of cancer can be detected and can often be prevented.  Routine colorectal cancer screening usually begins at age 22 and continues through age 60.  If you have risk factors for colon cancer, your health care provider may recommend that you be screened at an earlier age.  If you have a family history of colorectal cancer, talk with your health care provider about genetic screening.  Your health care provider may also recommend using home test  kits to check for hidden blood in your stool.  A small camera at the end of a tube can be used to examine your colon directly (sigmoidoscopy or colonoscopy). This is done to check for the earliest forms of colorectal cancer.  Direct examination of the colon should be repeated every 5-10 years until age 77. However, if early forms of precancerous polyps or small  growths are found or if you have a family history or genetic risk for colorectal cancer, you may need to be screened more often.  Skin Cancer  Check your skin from head to toe regularly.  Monitor any moles. Be sure to tell your health care provider: ? About any new moles or changes in moles, especially if there is a change in a mole's shape or color. ? If you have a mole that is larger than the size of a pencil eraser.  If any of your family members has a history of skin cancer, especially at a young age, talk with your health care provider about genetic screening.  Always use sunscreen. Apply sunscreen liberally and repeatedly throughout the day.  Whenever you are outside, protect yourself by wearing long sleeves, pants, a wide-brimmed hat, and sunglasses.  What should I know about osteoporosis? Osteoporosis is a condition in which bone destruction happens more quickly than new bone creation. After menopause, you may be at an increased risk for osteoporosis. To help prevent osteoporosis or the bone fractures that can happen because of osteoporosis, the following is recommended:  If you are 90-34 years old, get at least 1,000 mg of calcium and at least 600 mg of vitamin D per day.  If you are older than age 109 but younger than age 14, get at least 1,200 mg of calcium and at least 600 mg of vitamin D per day.  If you are older than age 66, get at least 1,200 mg of calcium and at least 800 mg of vitamin D per day.  Smoking and excessive alcohol intake increase the risk of osteoporosis. Eat foods that are rich in calcium and vitamin  D, and do weight-bearing exercises several times each week as directed by your health care provider. What should I know about how menopause affects my mental health? Depression may occur at any age, but it is more common as you become older. Common symptoms of depression include:  Low or sad mood.  Changes in sleep patterns.  Changes in appetite or eating patterns.  Feeling an overall lack of motivation or enjoyment of activities that you previously enjoyed.  Frequent crying spells.  Talk with your health care provider if you think that you are experiencing depression. What should I know about immunizations? It is important that you get and maintain your immunizations. These include:  Tetanus, diphtheria, and pertussis (Tdap) booster vaccine.  Influenza every year before the flu season begins.  Pneumonia vaccine.  Shingles vaccine.  Your health care provider may also recommend other immunizations. This information is not intended to replace advice given to you by your health care provider. Make sure you discuss any questions you have with your health care provider. Document Released: 09/03/2005 Document Revised: 01/30/2016 Document Reviewed: 04/15/2015 Elsevier Interactive Patient Education  2018 [employer].          No Follow-up on file.  Colin Benton R., DO

## 2017-01-21 ENCOUNTER — Encounter: Payer: Self-pay | Admitting: Family Medicine

## 2017-01-21 ENCOUNTER — Ambulatory Visit (INDEPENDENT_AMBULATORY_CARE_PROVIDER_SITE_OTHER): Payer: BLUE CROSS/BLUE SHIELD | Admitting: Family Medicine

## 2017-01-21 VITALS — BP 120/78 | HR 68 | Temp 98.4°F | Ht 64.5 in | Wt 163.4 lb

## 2017-01-21 DIAGNOSIS — M79641 Pain in right hand: Secondary | ICD-10-CM | POA: Diagnosis not present

## 2017-01-21 DIAGNOSIS — Z0000 Encounter for general adult medical examination without abnormal findings: Secondary | ICD-10-CM | POA: Diagnosis not present

## 2017-01-21 DIAGNOSIS — M79642 Pain in left hand: Secondary | ICD-10-CM

## 2017-01-21 DIAGNOSIS — I10 Essential (primary) hypertension: Secondary | ICD-10-CM

## 2017-01-21 DIAGNOSIS — Z90710 Acquired absence of both cervix and uterus: Secondary | ICD-10-CM | POA: Diagnosis not present

## 2017-01-21 LAB — CBC
HEMATOCRIT: 34.9 % — AB (ref 36.0–46.0)
Hemoglobin: 11.6 g/dL — ABNORMAL LOW (ref 12.0–15.0)
MCHC: 33.3 g/dL (ref 30.0–36.0)
MCV: 92.4 fl (ref 78.0–100.0)
Platelets: 285 10*3/uL (ref 150.0–400.0)
RBC: 3.78 Mil/uL — AB (ref 3.87–5.11)
RDW: 13.1 % (ref 11.5–15.5)
WBC: 6.2 10*3/uL (ref 4.0–10.5)

## 2017-01-21 LAB — LIPID PANEL
CHOLESTEROL: 215 mg/dL — AB (ref 0–200)
HDL: 74.4 mg/dL (ref >39.00)
LDL CALC: 129 mg/dL — AB (ref 0–99)
NonHDL: 140.11
TRIGLYCERIDES: 54 mg/dL (ref 0.0–149.0)
Total CHOL/HDL Ratio: 3
VLDL: 10.8 mg/dL (ref 0.0–40.0)

## 2017-01-21 LAB — BASIC METABOLIC PANEL
BUN: 17 mg/dL (ref 6–23)
CHLORIDE: 102 meq/L (ref 96–112)
CO2: 33 mEq/L — ABNORMAL HIGH (ref 19–32)
Calcium: 10 mg/dL (ref 8.4–10.5)
Creatinine, Ser: 0.96 mg/dL (ref 0.40–1.20)
GFR: 78.24 mL/min (ref >60.00)
Glucose, Bld: 93 mg/dL (ref 70–99)
POTASSIUM: 4 meq/L (ref 3.5–5.1)
SODIUM: 141 meq/L (ref 135–145)

## 2017-01-21 NOTE — Patient Instructions (Signed)
BEFORE YOU LEAVE: -labs -follow up: 3-4 months  We have ordered labs or studies at this visit. It can take up to 1-2 weeks for results and processing. IF results require follow up or explanation, we will call you with instructions. Clinically stable results will be released to your [facility]. If you have not heard from [place] or cannot find your results in [facility] in 2 weeks please contact our office at [phone].  If you are not yet signed up for [facility], please consider signing up.  Let us know if you would like to see the sports medicine doctor about your hand.   Health Maintenance for Postmenopausal Women Menopause is a normal process in which your reproductive ability comes to an end. This process happens gradually over a span of months to years, usually between the ages of 25 and 46. Menopause is complete when you have missed 12 consecutive menstrual periods. It is important to talk with your health care provider about some of the most common conditions that affect postmenopausal women, such as heart disease, cancer, and bone loss (osteoporosis). Adopting a healthy lifestyle and getting preventive care can help to promote your health and wellness. Those actions can also lower your chances of developing some of these common conditions. What should I know about menopause? During menopause, you may experience a number of symptoms, such as:  Moderate-to-severe hot flashes.  Night sweats.  Decrease in sex drive.  Mood swings.  Headaches.  Tiredness.  Irritability.  Memory problems.  Insomnia.  Choosing to treat or not to treat menopausal changes is an individual decision that you make with your health care provider. What should I know about hormone replacement therapy and supplements? Hormone therapy products are effective for treating symptoms that are associated with menopause, such as hot flashes and night sweats. Hormone replacement carries certain risks, especially as you  become older. If you are thinking about using estrogen or estrogen with progestin treatments, discuss the benefits and risks with your health care provider. What should I know about heart disease and stroke? Heart disease, heart attack, and stroke become more likely as you age. This may be due, in part, to the hormonal changes that your body experiences during menopause. These can affect how your body processes dietary fats, triglycerides, and cholesterol. Heart attack and stroke are both medical emergencies. There are many things that you can do to help prevent heart disease and stroke:  Have your blood pressure checked at least every 1-2 years. High blood pressure causes heart disease and increases the risk of stroke.  If you are 74-85 years old, ask your health care provider if you should take aspirin to prevent a heart attack or a stroke.  Do not use any tobacco products, including cigarettes, chewing tobacco, or electronic cigarettes. If you need help quitting, ask your health care provider.  It is important to eat a healthy diet and maintain a healthy weight. ? Be sure to include plenty of vegetables, fruits, low-fat dairy products, and lean protein. ? Avoid eating foods that are high in solid fats, added sugars, or salt (sodium).  Get regular exercise. This is one of the most important things that you can do for your health. ? Try to exercise for at least 150 minutes each week. The type of exercise that you do should increase your heart rate and make you sweat. This is known as moderate-intensity exercise. ? Try to do strengthening exercises at least twice each week. Do these in addition to  the moderate-intensity exercise.  Know your numbers.Ask your health care provider to check your cholesterol and your blood glucose. Continue to have your blood tested as directed by your health care provider.  What should I know about cancer screening? There are several types of cancer. Take the  following steps to reduce your risk and to catch any cancer development as early as possible. Breast Cancer  Practice breast self-awareness. ? This means understanding how your breasts normally appear and feel. ? It also means doing regular breast self-exams. Let your health care provider know about any changes, no matter how small.  If you are 67 or older, have a clinician do a breast exam (clinical breast exam or CBE) every year. Depending on your age, family history, and medical history, it may be recommended that you also have a yearly breast X-ray (mammogram).  If you have a family history of breast cancer, talk with your health care provider about genetic screening.  If you are at high risk for breast cancer, talk with your health care provider about having an MRI and a mammogram every year.  Breast cancer (BRCA) gene test is recommended for women who have family members with BRCA-related cancers. Results of the assessment will determine the need for genetic counseling and BRCA1 and for BRCA2 testing. BRCA-related cancers include these types: ? Breast. This occurs in males or females. ? Ovarian. ? Tubal. This may also be called fallopian tube cancer. ? Cancer of the abdominal or pelvic lining (peritoneal cancer). ? Prostate. ? Pancreatic.  Cervical, Uterine, and Ovarian Cancer Your health care provider may recommend that you be screened regularly for cancer of the pelvic organs. These include your ovaries, uterus, and vagina. This screening involves a pelvic exam, which includes checking for microscopic changes to the surface of your cervix (Pap test).  For women ages 21-65, health care providers may recommend a pelvic exam and a Pap test every three years. For women ages 83-65, they may recommend the Pap test and pelvic exam, combined with testing for human papilloma virus (HPV), every five years. Some types of HPV increase your risk of cervical cancer. Testing for HPV may also be done  on women of any age who have unclear Pap test results.  Other health care providers may not recommend any screening for nonpregnant women who are considered low risk for pelvic cancer and have no symptoms. Ask your health care provider if a screening pelvic exam is right for you.  If you have had past treatment for cervical cancer or a condition that could lead to cancer, you need Pap tests and screening for cancer for at least 20 years after your treatment. If Pap tests have been discontinued for you, your risk factors (such as having a new sexual partner) need to be reassessed to determine if you should start having screenings again. Some women have medical problems that increase the chance of getting cervical cancer. In these cases, your health care provider may recommend that you have screening and Pap tests more often.  If you have a family history of uterine cancer or ovarian cancer, talk with your health care provider about genetic screening.  If you have vaginal bleeding after reaching menopause, tell your health care provider.  There are currently no reliable tests available to screen for ovarian cancer.  Lung Cancer Lung cancer screening is recommended for adults 7-49 years old who are at high risk for lung cancer because of a history of smoking. A yearly low-dose  CT scan of the lungs is recommended if you:  Currently smoke.  Have a history of at least 30 pack-years of smoking and you currently smoke or have quit within the past 15 years. A pack-year is smoking an average of one pack of cigarettes per day for one year.  Yearly screening should:  Continue until it has been 15 years since you quit.  Stop if you develop a health problem that would prevent you from having lung cancer treatment.  Colorectal Cancer  This type of cancer can be detected and can often be prevented.  Routine colorectal cancer screening usually begins at age 23 and continues through age 44.  If you  have risk factors for colon cancer, your health care provider may recommend that you be screened at an earlier age.  If you have a family history of colorectal cancer, talk with your health care provider about genetic screening.  Your health care provider may also recommend using home test kits to check for hidden blood in your stool.  A small camera at the end of a tube can be used to examine your colon directly (sigmoidoscopy or colonoscopy). This is done to check for the earliest forms of colorectal cancer.  Direct examination of the colon should be repeated every 5-10 years until age 70. However, if early forms of precancerous polyps or small growths are found or if you have a family history or genetic risk for colorectal cancer, you may need to be screened more often.  Skin Cancer  Check your skin from head to toe regularly.  Monitor any moles. Be sure to tell your health care provider: ? About any new moles or changes in moles, especially if there is a change in a mole's shape or color. ? If you have a mole that is larger than the size of a pencil eraser.  If any of your family members has a history of skin cancer, especially at a young age, talk with your health care provider about genetic screening.  Always use sunscreen. Apply sunscreen liberally and repeatedly throughout the day.  Whenever you are outside, protect yourself by wearing long sleeves, pants, a wide-brimmed hat, and sunglasses.  What should I know about osteoporosis? Osteoporosis is a condition in which bone destruction happens more quickly than new bone creation. After menopause, you may be at an increased risk for osteoporosis. To help prevent osteoporosis or the bone fractures that can happen because of osteoporosis, the following is recommended:  If you are 32-36 years old, get at least 1,000 mg of calcium and at least 600 mg of vitamin D per day.  If you are older than age 51 but younger than age 59, get at  least 1,200 mg of calcium and at least 600 mg of vitamin D per day.  If you are older than age 16, get at least 1,200 mg of calcium and at least 800 mg of vitamin D per day.  Smoking and excessive alcohol intake increase the risk of osteoporosis. Eat foods that are rich in calcium and vitamin D, and do weight-bearing exercises several times each week as directed by your health care provider. What should I know about how menopause affects my mental health? Depression may occur at any age, but it is more common as you become older. Common symptoms of depression include:  Low or sad mood.  Changes in sleep patterns.  Changes in appetite or eating patterns.  Feeling an overall lack of motivation or enjoyment of  activities that you previously enjoyed.  Frequent crying spells.  Talk with your health care provider if you think that you are experiencing depression. What should I know about immunizations? It is important that you get and maintain your immunizations. These include:  Tetanus, diphtheria, and pertussis (Tdap) booster vaccine.  Influenza every year before the flu season begins.  Pneumonia vaccine.  Shingles vaccine.  Your health care provider may also recommend other immunizations. This information is not intended to replace advice given to you by your health care provider. Make sure you discuss any questions you have with your health care provider. Document Released: 09/03/2005 Document Revised: 01/30/2016 Document Reviewed: 04/15/2015 Elsevier Interactive Patient Education  2018 [employer].

## 2017-02-16 ENCOUNTER — Ambulatory Visit: Payer: PRIVATE HEALTH INSURANCE | Admitting: Obstetrics and Gynecology

## 2017-03-17 ENCOUNTER — Encounter: Payer: Self-pay | Admitting: Obstetrics and Gynecology

## 2017-04-22 ENCOUNTER — Ambulatory Visit: Payer: BLUE CROSS/BLUE SHIELD | Admitting: Family Medicine

## 2017-05-11 ENCOUNTER — Encounter: Payer: Self-pay | Admitting: Family Medicine

## 2017-05-11 NOTE — Progress Notes (Signed)
HPI:  Kimberly Walker is a pleasant 51 y.o. here for follow up. Chronic medical problems summarized below were reviewed for changes. She is doing well for the most part. Her only new complaint is that sometimes she feels some pain in the lateral right hip particularly when she goes from sitting to standing and sometimes when she lays on that side. This is been going on for a few weeks, is mild and is intermittent. No weakness, numbness or known trauma.. Denies CP, SOB, DOE, treatment intolerance or new symptoms. Due for labs, hep c, flu shot?  Hand pain: -occ pain in R hand over 4th mcp region and experienced catching/locking of finger once -she preferred to observe 12/2016 -no fevers, malaise or redness  Hypertension/Hyperlipidemia/Overweight: -chronic -meds: Hydrochlorthiazide 12.5 mg daily -wt 163 12/2016 --> 169 today  Anemia: -per prior PCP notes was to take iron for this -on labs as far back as 2009, normocytic  Hx migraines and frequent headaches: -chronic -stable -uses OTC medications rarely  Hx Abnormal uterine bleeding: - had an extensive evaluation resulting in a total hysterectomy with bilateral salpingo-oophorectomy in 2017  ROS: See pertinent positives and negatives per HPI.  Past Medical History:  Diagnosis Date  . Abnormal uterine bleeding    s/p total hysterectomy - benign pathology  . Anemia   . Frequent headaches   . Hyperlipidemia   . Hypertension   . Migraine   . Seizure [facility])    accompany migraines-last seizure 2011    Past Surgical History:  Procedure Laterality Date  . CESAREAN SECTION  1988  . CYSTOSCOPY N/A 02/10/2016   Procedure: CYSTOSCOPY;  Surgeon: Nunzio Cobbs, MD;  Location: [place] [facility];  Service: Gynecology;  Laterality: N/A;  . ROBOTIC ASSISTED TOTAL HYSTERECTOMY WITH BILATERAL SALPINGO OOPHERECTOMY Bilateral 02/10/2016   Procedure: ROBOTIC ASSISTED TOTAL HYSTERECTOMY WITH BILATERAL SALPINGO OOPHORECTOMY AND COLLECTION OF  PELVIC WASHINGS;  Surgeon: Nunzio Cobbs, MD;  Location: [place] [facility];  Service: Gynecology;  Laterality: Bilateral;  . TUBAL LIGATION      Family History  Problem Relation Age of Onset  . Diabetes Mother   . Hypertension Mother     Social History   Social History  . Marital status: Married    Spouse name: N/A  . Number of children: N/A  . Years of education: N/A   Social History Main Topics  . Smoking status: Never Smoker  . Smokeless tobacco: Never Used  . Alcohol use No  . Drug use: No  . Sexual activity: Yes    Partners: Male    Birth control/ protection: Surgical     Comment: Tubal   Other Topics Concern  . None   Social History Narrative   Updated 08/2015   Work or School: production - boarding [employer]      Home Situation: Lives with husband - feels safe at home      Spiritual Beliefs: Christian      Lifestyle: no regular aerobic exercise, diet so so           Current Outpatient Prescriptions:  .  ferrous sulfate 325 (65 FE) MG tablet, Take 325 mg by mouth daily with breakfast., Disp: , Rfl:  .  hydrochlorothiazide (HYDRODIURIL) 25 MG tablet, Take 1 tablet (25 mg total) by mouth daily., Disp: 90 tablet, Rfl: 3  Current Facility-Administered Medications:  .  0.9 %  sodium chloride infusion, 500 mL, Intravenous, Continuous, Armbruster, Carlota Raspberry, MD  EXAM:  Vitals:  05/13/17 1108  BP: 102/76  Pulse: 76  Temp: 98.6 F (37 C)    Body mass index is 28.56 kg/m.  GENERAL: vitals reviewed and listed above, alert, oriented, appears well hydrated and in no acute distress  HEENT: atraumatic, conjunttiva clear, no obvious abnormalities on inspection of external nose and ears  NECK: no obvious masses on inspection  LUNGS: clear to auscultation bilaterally, no wheezes, rales or rhonchi, good air movement  CV: HRRR, no peripheral edema  MS: moves all extremities without noticeable abnormality, normal inspection of both hips and lower  extremities, no weakness or loss of sensation, tenderness to palpation in the IT band, no bony tenderness to palpation, NV intact distal  PSYCH: pleasant and cooperative, no obvious depression or anxiety  ASSESSMENT AND PLAN:  Discussed the following assessment and plan:  Essential hypertension - Plan: Basic metabolic panel, CBC  Anemia, unspecified type  Encounter for hepatitis C virus screening test for high risk patient - Plan: Hepatitis C antibody  Lateral pain of right hip  -Labs -Lifestyle recommendations -Hepatitis C screening -IT band exercises for the lateral hip and leg pain, advised that she call for a return visit in one month if pain persist, sooner if any worsening pain -Patient advised to return or notify a doctor immediately if symptoms worsen or persist or new concerns arise.  Patient Instructions  BEFORE YOU LEAVE: -IT band exercises -labs -follow up: 3-4 months   We have ordered labs or studies at this visit. It can take up to 1-2 weeks for results and processing. IF results require follow up or explanation, we will call you with instructions. Clinically stable results will be released to your [facility]. If you have not heard from [place] or cannot find your results in [facility] in 2 weeks please contact our office at [phone].  If you are not yet signed up for [facility], please consider signing up.  Advise regular aerobic exercise (at least 150 minutes per week of sweaty exercise) and a healthy diet. Try to eat at least 5-9 servings of vegetables and fruits per day (not corn, potatoes or bananas.) Avoid sweets, red meat, pork, butter, fried foods, fast food, processed food, excessive dairy, eggs and coconut. Replace bad fats with good fats - fish, nuts and seeds, canola oil, olive oil.             Colin Benton R., DO

## 2017-05-13 ENCOUNTER — Ambulatory Visit (INDEPENDENT_AMBULATORY_CARE_PROVIDER_SITE_OTHER): Payer: BLUE CROSS/BLUE SHIELD | Admitting: Family Medicine

## 2017-05-13 ENCOUNTER — Encounter: Payer: Self-pay | Admitting: Family Medicine

## 2017-05-13 VITALS — BP 102/76 | HR 76 | Temp 98.6°F | Ht 64.5 in | Wt 169.0 lb

## 2017-05-13 DIAGNOSIS — Z23 Encounter for immunization: Secondary | ICD-10-CM | POA: Diagnosis not present

## 2017-05-13 DIAGNOSIS — I10 Essential (primary) hypertension: Secondary | ICD-10-CM

## 2017-05-13 DIAGNOSIS — Z1159 Encounter for screening for other viral diseases: Secondary | ICD-10-CM

## 2017-05-13 DIAGNOSIS — Z9189 Other specified personal risk factors, not elsewhere classified: Secondary | ICD-10-CM | POA: Diagnosis not present

## 2017-05-13 DIAGNOSIS — M25551 Pain in right hip: Secondary | ICD-10-CM | POA: Diagnosis not present

## 2017-05-13 DIAGNOSIS — D649 Anemia, unspecified: Secondary | ICD-10-CM

## 2017-05-13 LAB — BASIC METABOLIC PANEL
BUN: 17 mg/dL (ref 6–23)
CALCIUM: 10 mg/dL (ref 8.4–10.5)
CHLORIDE: 99 meq/L (ref 96–112)
CO2: 35 meq/L — AB (ref 19–32)
CREATININE: 0.93 mg/dL (ref 0.40–1.20)
GFR: 81.07 mL/min (ref >60.00)
Glucose, Bld: 90 mg/dL (ref 70–99)
Potassium: 4.1 mEq/L (ref 3.5–5.1)
SODIUM: 139 meq/L (ref 135–145)

## 2017-05-13 LAB — CBC
HCT: 34.1 % — ABNORMAL LOW (ref 36.0–46.0)
Hemoglobin: 11.3 g/dL — ABNORMAL LOW (ref 12.0–15.0)
MCHC: 33.1 g/dL (ref 30.0–36.0)
MCV: 93.2 fl (ref 78.0–100.0)
PLATELETS: 271 10*3/uL (ref 150.0–400.0)
RBC: 3.66 Mil/uL — AB (ref 3.87–5.11)
RDW: 13.1 % (ref 11.5–15.5)
WBC: 5.4 10*3/uL (ref 4.0–10.5)

## 2017-05-13 NOTE — Addendum Note (Signed)
Addended by: Agnes Lawrence on: 05/13/2017 11:42 AM   Modules accepted: Orders

## 2017-05-13 NOTE — Patient Instructions (Signed)
BEFORE YOU LEAVE: -IT band exercises -labs -follow up: 3-4 months   We have ordered labs or studies at this visit. It can take up to 1-2 weeks for results and processing. IF results require follow up or explanation, we will call you with instructions. Clinically stable results will be released to your [facility]. If you have not heard from [place] or cannot find your results in [facility] in 2 weeks please contact our office at [phone].  If you are not yet signed up for [facility], please consider signing up.  Advise regular aerobic exercise (at least 150 minutes per week of sweaty exercise) and a healthy diet. Try to eat at least 5-9 servings of vegetables and fruits per day (not corn, potatoes or bananas.) Avoid sweets, red meat, pork, butter, fried foods, fast food, processed food, excessive dairy, eggs and coconut. Replace bad fats with good fats - fish, nuts and seeds, canola oil, olive oil.

## 2017-05-14 LAB — HEPATITIS C ANTIBODY
Hepatitis C Ab: NONREACTIVE
SIGNAL TO CUT-OFF: 0.01 (ref <1.00)

## 2017-09-02 ENCOUNTER — Ambulatory Visit: Payer: BLUE CROSS/BLUE SHIELD | Admitting: Family Medicine

## 2017-09-15 ENCOUNTER — Other Ambulatory Visit: Payer: Self-pay | Admitting: *Deleted

## 2017-09-15 MED ORDER — HYDROCHLOROTHIAZIDE 25 MG PO TABS: 25.0000 mg | ORAL_TABLET | Freq: Every day | ORAL | 1 refills | Status: DC

## 2017-09-15 NOTE — Telephone Encounter (Signed)
Rx done. 

## 2017-09-29 ENCOUNTER — Ambulatory Visit: Payer: BLUE CROSS/BLUE SHIELD | Admitting: Family Medicine

## 2018-03-09 ENCOUNTER — Other Ambulatory Visit: Payer: Self-pay | Admitting: Family Medicine

## 2018-06-07 ENCOUNTER — Other Ambulatory Visit: Payer: Self-pay | Admitting: Family Medicine

## 2018-07-01 ENCOUNTER — Other Ambulatory Visit: Payer: Self-pay | Admitting: Family Medicine

## 2018-07-29 ENCOUNTER — Other Ambulatory Visit: Payer: Self-pay | Admitting: Family Medicine

## 2018-09-03 ENCOUNTER — Other Ambulatory Visit: Payer: Self-pay | Admitting: Family Medicine

## 2018-09-11 ENCOUNTER — Other Ambulatory Visit: Payer: Self-pay | Admitting: *Deleted

## 2018-09-11 MED ORDER — HYDROCHLOROTHIAZIDE 25 MG PO TABS: 25.0000 mg | ORAL_TABLET | Freq: Every day | ORAL | 0 refills | Status: DC

## 2018-09-11 NOTE — Telephone Encounter (Signed)
Rx done. 

## 2018-10-08 ENCOUNTER — Other Ambulatory Visit: Payer: Self-pay | Admitting: Family Medicine

## 2018-11-05 ENCOUNTER — Other Ambulatory Visit: Payer: Self-pay | Admitting: Family Medicine

## 2018-11-06 ENCOUNTER — Other Ambulatory Visit: Payer: Self-pay | Admitting: Family Medicine

## 2018-11-13 ENCOUNTER — Other Ambulatory Visit: Payer: Self-pay | Admitting: Family Medicine

## 2018-11-14 NOTE — Telephone Encounter (Signed)
I left a message for the pt to call the office and schedule a telephone visit with Dr Maudie Mercury as it has been over a year since her last visit.

## 2018-11-21 ENCOUNTER — Other Ambulatory Visit: Payer: Self-pay

## 2018-11-21 ENCOUNTER — Ambulatory Visit (INDEPENDENT_AMBULATORY_CARE_PROVIDER_SITE_OTHER): Payer: Self-pay | Admitting: Family Medicine

## 2018-11-21 ENCOUNTER — Encounter: Payer: Self-pay | Admitting: Family Medicine

## 2018-11-21 VITALS — BP 150/98 | Wt 184.0 lb

## 2018-11-21 DIAGNOSIS — E669 Obesity, unspecified: Secondary | ICD-10-CM

## 2018-11-21 DIAGNOSIS — I10 Essential (primary) hypertension: Secondary | ICD-10-CM

## 2018-11-21 DIAGNOSIS — D509 Iron deficiency anemia, unspecified: Secondary | ICD-10-CM

## 2018-11-21 MED ORDER — HYDROCHLOROTHIAZIDE 25 MG PO TABS: 25.0000 mg | ORAL_TABLET | Freq: Every day | ORAL | 1 refills | Status: DC

## 2018-11-21 NOTE — Progress Notes (Signed)
Virtual Visit via Video Note  I connected with Kimberly Walker  on 11/21/18 at  4:30 PM EDT by a video enabled telemedicine application and verified that I am speaking with the correct person using two identifiers.  Location patient: home Location provider:work or home office Persons participating in the virtual visit: patient, provider, provider's daughter  I discussed the limitations of evaluation and management by telemedicine and the availability of in person appointments. The patient expressed understanding and agreed to proceed.   HPI:  Kimberly Walker is a pleasant 53 y.o. here for follow up. Chronic medical problems summarized below were reviewed for changes and stability and were updated as needed below. These issues and their treatment remain stable for the most part. Ran out of medication recently for BP. Not seen in over 1 year as reports did not have insurance. Plans to get insurance through her job in November. Denies CP, SOB, DOE, treatment intolerance or new symptoms.  Hypertension/Hyperlipidemia/Overweight: -chronic -meds: Hydrochlorthiazide 25 mg daily -she has been walking a lot -she is working packing products, and stays 6 feet away, and wears mask -ran out of BP medication yesterday -no CP, SOB, DOE, vision changes or headaches -wt 163 12/2016 --> 169 04/2017 --> wt 184 lb  Anemia: -per prior PCP notes was to take iron for this -on labs as far back as 2009, normocytic  Hx migraines and frequent headaches: -chronic -reports no headaches recently -uses OTC medications rarely  Hx Abnormal uterine bleeding: -had an extensive evaluation resulting in a total hysterectomy with bilateral salpingo-oophorectomy in 2017  ROS: See pertinent positives and negatives per HPI.  Past Medical History:  Diagnosis Date  . Abnormal uterine bleeding    s/p total hysterectomy - benign pathology  . Anemia   . Frequent headaches   . Hyperlipidemia   . Hypertension   . Migraine    . Seizure [facility])    accompany migraines-last seizure 2011    Past Surgical History:  Procedure Laterality Date  . CESAREAN SECTION  1988  . CYSTOSCOPY N/A 02/10/2016   Procedure: CYSTOSCOPY;  Surgeon: Nunzio Cobbs, MD;  Location: [place] [facility];  Service: Gynecology;  Laterality: N/A;  . ROBOTIC ASSISTED TOTAL HYSTERECTOMY WITH BILATERAL SALPINGO OOPHERECTOMY Bilateral 02/10/2016   Procedure: ROBOTIC ASSISTED TOTAL HYSTERECTOMY WITH BILATERAL SALPINGO OOPHORECTOMY AND COLLECTION OF PELVIC WASHINGS;  Surgeon: Nunzio Cobbs, MD;  Location: [place] [facility];  Service: Gynecology;  Laterality: Bilateral;  . TUBAL LIGATION      Family History  Problem Relation Age of Onset  . Diabetes Mother   . Hypertension Mother     SOCIAL HX: see hpi   Current Outpatient Medications:  .  ferrous sulfate 325 (65 FE) MG tablet, Take 325 mg by mouth daily with breakfast., Disp: , Rfl:  .  hydrochlorothiazide (HYDRODIURIL) 25 MG tablet, Take 1 tablet (25 mg total) by mouth daily., Disp: 90 tablet, Rfl: 1  Current Facility-Administered Medications:  .  0.9 %  sodium chloride infusion, 500 mL, Intravenous, Continuous, Armbruster, Carlota Raspberry, MD  EXAMTonette Walker per patient if applicable: Vitals:   50/93/26 1642  BP: (!) 150/98   Last Weight  Most recent update: 11/21/2018  4:51 PM   Weight  83.5 kg (184 lb)            GENERAL: alert, oriented, appears well and in no acute distress  HEENT: atraumatic, conjunttiva clear, no obvious abnormalities on inspection of external nose and ears  NECK: normal movements  of the head and neck  LUNGS: on inspection no signs of respiratory distress, breathing rate appears normal, no obvious gross SOB, gasping or wheezing  CV: no obvious cyanosis  MS: moves all visible extremities without noticeable abnormality  PSYCH/NEURO: pleasant and cooperative, no obvious depression or anxiety, speech and thought processing grossly intact  ASSESSMENT AND  PLAN:  Discussed the following assessment and plan:  Essential hypertension  Iron deficiency anemia, unspecified iron deficiency anemia type  Obesity with serious comorbidity, unspecified classification, unspecified obesity type  BP is elevated - was higher after walking into the house with 170/110, then came down to 150/98 with sitting. Has not been taking medication she reports the last few days as ran out. Reports feels fine with no symptoms. Opted to recheck after back on medication. Advise will have assistant call her to set up vital check and f/u visit. Also advised labs, she wants to know cost before agreeing to labs.  Advised wt reduction, discussed healthy low sugar diet and advised regular exercise.   I discussed the assessment and treatment plan with the patient. The patient was provided an opportunity to ask questions and all were answered. The patient agreed with the plan and demonstrated an understanding of the instructions.   The patient was advised to call back or seek an in-person evaluation if the symptoms worsen or if the condition fails to improve as anticipated.   Follow up instructions: Advised assistant Wendie Simmer to help patient arrange the following: -she is cash pay - can you check with lab on cost of getting: BMP, lipids, cbc? Let patient know. Order these labs and set up lab visit if she wishes to do them -vital visit to check BP and pulse in 2 weeks with virtual visit with Dr. Maudie Mercury afterwards   Lucretia Kern, DO

## 2018-12-05 ENCOUNTER — Telehealth: Payer: Self-pay | Admitting: *Deleted

## 2018-12-05 NOTE — Telephone Encounter (Signed)
Copied from [place]. Topic: General - Call Back - No Documentation >> Dec 04, 2018  4:46 PM Rutherford Nail, NT wrote: Reason for CRM: Patient calling and states that she is returning a call to the office regarding self pay pricing for some blood work. States that the pricing can be left on her voicemail if she does not answer. Please advise.

## 2018-12-05 NOTE — Telephone Encounter (Signed)
I left a detailed message at the pts cell number stating the approximate charges for the following labs are: -BMP $24, lipid $39, and CBC $24 and asked that she call back to schedule a lab appt if she prefers.

## 2019-05-11 ENCOUNTER — Telehealth: Payer: Self-pay | Admitting: Family Medicine

## 2019-05-11 NOTE — Telephone Encounter (Signed)
Copied from [place] [phone]. Topic: General - Other >> May 11, 2019  9:45 AM Keene Breath wrote: Reason for CRM: Patient called to ask the nurse to call her regarding getting a refill for her medication.  She stated that her insurance does not start until November and she only has 7 pills left.  Please advise and call to discuss.  CB# [phone]

## 2019-05-13 ENCOUNTER — Other Ambulatory Visit: Payer: Self-pay | Admitting: Family Medicine

## 2019-05-14 MED ORDER — HYDROCHLOROTHIAZIDE 25 MG PO TABS: 25.0000 mg | ORAL_TABLET | Freq: Every day | ORAL | 0 refills | Status: DC

## 2019-05-14 NOTE — Telephone Encounter (Signed)
Pt will not have insurance until Mid-November.  Scheduled with Dr. Jerilee Hoh at that time.  Pt does need medication now.

## 2019-05-14 NOTE — Telephone Encounter (Signed)
Rx done. 

## 2019-05-14 NOTE — Telephone Encounter (Signed)
l called the pt to inform her she needs to schedule an appt with a new PCP of her choice and a temporary refill can be given until that appt and she stated she is driving and will call back.  CRM also created.

## 2019-06-13 ENCOUNTER — Other Ambulatory Visit: Payer: Self-pay | Admitting: Family Medicine

## 2019-06-13 ENCOUNTER — Other Ambulatory Visit: Payer: Self-pay

## 2019-06-14 ENCOUNTER — Encounter: Payer: Self-pay | Admitting: Internal Medicine

## 2019-06-14 ENCOUNTER — Ambulatory Visit (INDEPENDENT_AMBULATORY_CARE_PROVIDER_SITE_OTHER): Payer: Self-pay | Admitting: Internal Medicine

## 2019-06-14 VITALS — BP 120/80 | HR 73 | Temp 97.2°F | Ht 65.0 in | Wt 179.6 lb

## 2019-06-14 DIAGNOSIS — G43009 Migraine without aura, not intractable, without status migrainosus: Secondary | ICD-10-CM

## 2019-06-14 DIAGNOSIS — D500 Iron deficiency anemia secondary to blood loss (chronic): Secondary | ICD-10-CM

## 2019-06-14 DIAGNOSIS — I10 Essential (primary) hypertension: Secondary | ICD-10-CM

## 2019-06-14 DIAGNOSIS — N938 Other specified abnormal uterine and vaginal bleeding: Secondary | ICD-10-CM

## 2019-06-14 NOTE — Progress Notes (Signed)
Established Patient Office Visit     CC/Reason for Visit: Establish care, discuss chronic medical conditions  HPI: Kimberly Walker is a 53 y.o. female who is coming in today for the above mentioned reasons. Past Medical History is significant for: Hypertension that has been well controlled on hydrochlorothiazide, prior history of migraine headaches that is improved after hysterectomy, iron deficiency anemia due to dysfunctional uterine bleeding for which she is status post hysterectomy and bilateral salpingo-oophorectomy in 2017.  She has no acute complaints today.  Her insurance does not kick in until January 1, so she would like to avoid any vaccines or lab work for now.  She works in Oncologist for [employer], she is a never smoker, no alcohol use, no known drug allergies, her past surgical history is only significant for TAH plus BSO in 2017.  Her family history is only significant for hypertension in her mother.     Past Medical/Surgical History: Past Medical History:  Diagnosis Date  . Abnormal uterine bleeding    s/p total hysterectomy - benign pathology  . Anemia   . Frequent headaches   . Hyperlipidemia   . Hypertension   . Migraine   . Seizure [facility])    accompany migraines-last seizure 2011    Past Surgical History:  Procedure Laterality Date  . CESAREAN SECTION  1988  . CYSTOSCOPY N/A 02/10/2016   Procedure: CYSTOSCOPY;  Surgeon: Nunzio Cobbs, MD;  Location: [facility];  Service: Gynecology;  Laterality: N/A;  . ROBOTIC ASSISTED TOTAL HYSTERECTOMY WITH BILATERAL SALPINGO OOPHERECTOMY Bilateral 02/10/2016   Procedure: ROBOTIC ASSISTED TOTAL HYSTERECTOMY WITH BILATERAL SALPINGO OOPHORECTOMY AND COLLECTION OF PELVIC WASHINGS;  Surgeon: Nunzio Cobbs, MD;  Location: [facility];  Service: Gynecology;  Laterality: Bilateral;  . TUBAL LIGATION      Social History:  reports that she has never smoked. She has never used smokeless  tobacco. She reports that she does not drink alcohol or use drugs.  Allergies: No Known Allergies  Family History:  Family History  Problem Relation Age of Onset  . Diabetes Mother   . Hypertension Mother      Current Outpatient Medications:  .  ferrous sulfate 325 (65 FE) MG tablet, Take 325 mg by mouth daily with breakfast., Disp: , Rfl:  .  hydrochlorothiazide (HYDRODIURIL) 25 MG tablet, TAKE 1 TABLET BY MOUTH EVERY DAY, Disp: 30 tablet, Rfl: 0  Current Facility-Administered Medications:  .  0.9 %  sodium chloride infusion, 500 mL, Intravenous, Continuous, Armbruster, Carlota Raspberry, MD  Review of Systems:  Constitutional: Denies fever, chills, diaphoresis, appetite change and fatigue.  HEENT: Denies photophobia, eye pain, redness, hearing loss, ear pain, congestion, sore throat, rhinorrhea, sneezing, mouth sores, trouble swallowing, neck pain, neck stiffness and tinnitus.   Respiratory: Denies SOB, DOE, cough, chest tightness,  and wheezing.   Cardiovascular: Denies chest pain, palpitations and leg swelling.  Gastrointestinal: Denies nausea, vomiting, abdominal pain, diarrhea, constipation, blood in stool and abdominal distention.  Genitourinary: Denies dysuria, urgency, frequency, hematuria, flank pain and difficulty urinating.  Endocrine: Denies: hot or cold intolerance, sweats, changes in hair or nails, polyuria, polydipsia. Musculoskeletal: Denies myalgias, back pain, joint swelling, arthralgias and gait problem.  Skin: Denies pallor, rash and wound.  Neurological: Denies dizziness, seizures, syncope, weakness, light-headedness, numbness and headaches.  Hematological: Denies adenopathy. Easy bruising, personal or family bleeding history  Psychiatric/Behavioral: Denies suicidal ideation, mood changes, confusion, nervousness, sleep disturbance and agitation  Physical Exam: Vitals:   06/14/19 1322  BP: 120/80  Pulse: 73  Temp: (!) 97.2 F (36.2 C)  TempSrc: Temporal   SpO2: 97%  Weight: 179 lb 9.6 oz (81.5 kg)  Height: 5\' 5"  (1.651 m)    Body mass index is 29.89 kg/m.   Constitutional: NAD, calm, comfortable Eyes: PERRL, lids and conjunctivae normal, wears corrective lenses ENMT: Mucous membranes are moist. Respiratory: clear to auscultation bilaterally, no wheezing, no crackles. Normal respiratory effort. No accessory muscle use.  Cardiovascular: Regular rate and rhythm, no murmurs / rubs / gallops. No extremity edema. 2+ pedal pulses.  Abdomen: no tenderness, no masses palpated. No hepatosplenomegaly. Bowel sounds positive.  Musculoskeletal: no clubbing / cyanosis. No joint deformity upper and lower extremities. Good ROM, no contractures. Normal muscle tone.  Skin: no rashes, lesions, ulcers. No induration Neurologic: Grossly intact and nonfocal Psychiatric: Normal judgment and insight. Alert and oriented x 3. Normal mood.    Impression and Plan:  Essential hypertension -Well-controlled on hydrochlorothiazide.  Iron deficiency anemia due to chronic blood loss -Check CBC and anemia panel when she returns.  Migraine without aura and without status migrainosus, not intractable -Not currently active.  DUB (dysfunctional uterine bleeding) -Resolved after hysterectomy.    Patient Instructions  -Nice seeing you today!!  -Schedule follow up in 3 months for your physical.     Lelon Frohlich, MD [facility]

## 2019-06-14 NOTE — Patient Instructions (Signed)
-  Nice seeing you today!!  -Schedule follow up in 3 months for your physical.

## 2019-07-07 ENCOUNTER — Other Ambulatory Visit: Payer: Self-pay | Admitting: Family Medicine

## 2019-10-15 ENCOUNTER — Telehealth: Payer: Self-pay | Admitting: Internal Medicine

## 2019-10-15 DIAGNOSIS — D500 Iron deficiency anemia secondary to blood loss (chronic): Secondary | ICD-10-CM

## 2019-10-15 NOTE — Telephone Encounter (Signed)
Pt now has medical insurance and would like to have the lab work done that Dr. Jerilee Hoh mentioned pt needed.

## 2019-10-16 NOTE — Telephone Encounter (Signed)
Labs ordered.  attempted to call patient but unable to leave a message for patient to schedule a lab appointment

## 2019-10-17 NOTE — Telephone Encounter (Signed)
2nd attempt to call patient, but unable to leave a message.

## 2019-10-18 ENCOUNTER — Other Ambulatory Visit: Payer: Self-pay

## 2019-10-19 ENCOUNTER — Other Ambulatory Visit (INDEPENDENT_AMBULATORY_CARE_PROVIDER_SITE_OTHER): Payer: BLUE CROSS/BLUE SHIELD

## 2019-10-19 DIAGNOSIS — D500 Iron deficiency anemia secondary to blood loss (chronic): Secondary | ICD-10-CM

## 2019-10-19 LAB — CBC WITH DIFFERENTIAL/PLATELET
Basophils Absolute: 0 10*3/uL (ref 0.0–0.1)
Basophils Relative: 0.4 % (ref 0.0–3.0)
Eosinophils Absolute: 0.1 10*3/uL (ref 0.0–0.7)
Eosinophils Relative: 1.4 % (ref 0.0–5.0)
HCT: 35.1 % — ABNORMAL LOW (ref 36.0–46.0)
Hemoglobin: 11.7 g/dL — ABNORMAL LOW (ref 12.0–15.0)
Lymphocytes Relative: 33.6 % (ref 12.0–46.0)
Lymphs Abs: 1.8 10*3/uL (ref 0.7–4.0)
MCHC: 33.4 g/dL (ref 30.0–36.0)
MCV: 93.8 fl (ref 78.0–100.0)
Monocytes Absolute: 0.3 10*3/uL (ref 0.1–1.0)
Monocytes Relative: 5.2 % (ref 3.0–12.0)
Neutro Abs: 3.3 10*3/uL (ref 1.4–7.7)
Neutrophils Relative %: 59.4 % (ref 43.0–77.0)
Platelets: 279 10*3/uL (ref 150.0–400.0)
RBC: 3.74 Mil/uL — ABNORMAL LOW (ref 3.87–5.11)
RDW: 12.9 % (ref 11.5–15.5)
WBC: 5.5 10*3/uL (ref 4.0–10.5)

## 2019-10-19 NOTE — Telephone Encounter (Signed)
Left message on machine for patient to return our call.  Okay to schedule a lab appointment.  Orders are in.

## 2019-10-23 LAB — ANEMIA PANEL
Ferritin: 228 ng/mL — ABNORMAL HIGH (ref 15–150)
Folate, Hemolysate: 375 ng/mL
Folate, RBC: 1084 ng/mL (ref >498)
Hematocrit: 34.6 % (ref 34.0–46.6)
Iron Saturation: 29 % (ref 15–55)
Iron: 79 ug/dL (ref 27–159)
Retic Ct Pct: 1.6 % (ref 0.6–2.6)
Total Iron Binding Capacity: 276 ug/dL (ref 250–450)
UIBC: 197 ug/dL (ref 131–425)
Vitamin B-12: 726 pg/mL (ref 232–1245)

## 2020-01-06 ENCOUNTER — Other Ambulatory Visit: Payer: Self-pay | Admitting: Internal Medicine

## 2020-04-08 ENCOUNTER — Telehealth: Payer: Self-pay | Admitting: Internal Medicine

## 2020-04-08 MED ORDER — HYDROCHLOROTHIAZIDE 25 MG PO TABS: 25.0000 mg | ORAL_TABLET | Freq: Every day | ORAL | 1 refills | Status: DC

## 2020-04-08 NOTE — Telephone Encounter (Signed)
Pt stated she lost her medication hydrochlorothiazide and cannot find it. She is asking for a refill and to be called back at [phone]   [id number] [address], [place] Phone:  [phone]  Fax:  [phone]

## 2020-10-03 ENCOUNTER — Other Ambulatory Visit: Payer: Self-pay | Admitting: Internal Medicine

## 2020-12-09 ENCOUNTER — Other Ambulatory Visit: Payer: Self-pay

## 2020-12-10 ENCOUNTER — Ambulatory Visit: Payer: BLUE CROSS/BLUE SHIELD | Admitting: Internal Medicine

## 2020-12-16 ENCOUNTER — Other Ambulatory Visit: Payer: Self-pay

## 2020-12-17 ENCOUNTER — Ambulatory Visit (INDEPENDENT_AMBULATORY_CARE_PROVIDER_SITE_OTHER): Payer: BLUE CROSS/BLUE SHIELD | Admitting: Internal Medicine

## 2020-12-17 ENCOUNTER — Encounter: Payer: Self-pay | Admitting: Internal Medicine

## 2020-12-17 VITALS — BP 130/86 | HR 82 | Temp 98.2°F | Wt 178.7 lb

## 2020-12-17 DIAGNOSIS — Z1211 Encounter for screening for malignant neoplasm of colon: Secondary | ICD-10-CM

## 2020-12-17 DIAGNOSIS — Z1231 Encounter for screening mammogram for malignant neoplasm of breast: Secondary | ICD-10-CM

## 2020-12-17 DIAGNOSIS — Z0000 Encounter for general adult medical examination without abnormal findings: Secondary | ICD-10-CM

## 2020-12-17 DIAGNOSIS — Z23 Encounter for immunization: Secondary | ICD-10-CM | POA: Diagnosis not present

## 2020-12-17 DIAGNOSIS — R632 Polyphagia: Secondary | ICD-10-CM | POA: Diagnosis not present

## 2020-12-17 LAB — COMPREHENSIVE METABOLIC PANEL
ALT: 13 U/L (ref 0–35)
AST: 18 U/L (ref 0–37)
Albumin: 4.4 g/dL (ref 3.5–5.2)
Alkaline Phosphatase: 124 U/L — ABNORMAL HIGH (ref 39–117)
BUN: 20 mg/dL (ref 6–23)
CO2: 31 mEq/L (ref 19–32)
Calcium: 9.8 mg/dL (ref 8.4–10.5)
Chloride: 101 mEq/L (ref 96–112)
Creatinine, Ser: 0.84 mg/dL (ref 0.40–1.20)
GFR: 77.52 mL/min (ref >60.00)
Glucose, Bld: 95 mg/dL (ref 70–99)
Potassium: 3.9 mEq/L (ref 3.5–5.1)
Sodium: 140 mEq/L (ref 135–145)
Total Bilirubin: 0.4 mg/dL (ref 0.2–1.2)
Total Protein: 7.3 g/dL (ref 6.0–8.3)

## 2020-12-17 LAB — POCT GLYCOSYLATED HEMOGLOBIN (HGB A1C): Hemoglobin A1C: 5.4 % (ref 4.0–5.6)

## 2020-12-17 LAB — CBC WITH DIFFERENTIAL/PLATELET
Basophils Absolute: 0 10*3/uL (ref 0.0–0.1)
Basophils Relative: 0.4 % (ref 0.0–3.0)
Eosinophils Absolute: 0.1 10*3/uL (ref 0.0–0.7)
Eosinophils Relative: 1.6 % (ref 0.0–5.0)
HCT: 35.9 % — ABNORMAL LOW (ref 36.0–46.0)
Hemoglobin: 12.1 g/dL (ref 12.0–15.0)
Lymphocytes Relative: 27.8 % (ref 12.0–46.0)
Lymphs Abs: 1.7 10*3/uL (ref 0.7–4.0)
MCHC: 33.7 g/dL (ref 30.0–36.0)
MCV: 92.4 fl (ref 78.0–100.0)
Monocytes Absolute: 0.3 10*3/uL (ref 0.1–1.0)
Monocytes Relative: 5.2 % (ref 3.0–12.0)
Neutro Abs: 4.1 10*3/uL (ref 1.4–7.7)
Neutrophils Relative %: 65 % (ref 43.0–77.0)
Platelets: 294 10*3/uL (ref 150.0–400.0)
RBC: 3.88 Mil/uL (ref 3.87–5.11)
RDW: 12.9 % (ref 11.5–15.5)
WBC: 6.3 10*3/uL (ref 4.0–10.5)

## 2020-12-17 LAB — TSH: TSH: 1.17 u[IU]/mL (ref 0.35–4.50)

## 2020-12-17 LAB — LIPID PANEL
Cholesterol: 200 mg/dL (ref 0–200)
HDL: 61.4 mg/dL (ref >39.00)
LDL Cholesterol: 119 mg/dL — ABNORMAL HIGH (ref 0–99)
NonHDL: 138.14
Total CHOL/HDL Ratio: 3
Triglycerides: 95 mg/dL (ref 0.0–149.0)
VLDL: 19 mg/dL (ref 0.0–40.0)

## 2020-12-17 LAB — VITAMIN D 25 HYDROXY (VIT D DEFICIENCY, FRACTURES): VITD: 48.55 ng/mL (ref 30.00–100.00)

## 2020-12-17 LAB — VITAMIN B12: Vitamin B-12: 535 pg/mL (ref 211–911)

## 2020-12-17 NOTE — Addendum Note (Signed)
Addended by: Westley Hummer B on: 12/17/2020 09:44 AM   Modules accepted: Orders

## 2020-12-17 NOTE — Patient Instructions (Signed)
-Nice seeing you today!!  -Lab work today; will notify you once results are available.  -First shingles vaccine today.  -Remember your 3rd and 4th COVID vaccines.  -Schedule follow up in 6 months.   Preventive Care 44-57 Years Old, Female Preventive care refers to lifestyle choices and visits with your health care provider that can promote health and wellness. This includes:  A yearly physical exam. This is also called an annual wellness visit.  Regular dental and eye exams.  Immunizations.  Screening for certain conditions.  Healthy lifestyle choices, such as: ? Eating a healthy diet. ? Getting regular exercise. ? Not using drugs or products that contain nicotine and tobacco. ? Limiting alcohol use. What can I expect for my preventive care visit? Physical exam Your health care provider will check your:  Height and weight. These may be used to calculate your BMI (body mass index). BMI is a measurement that tells if you are at a healthy weight.  Heart rate and blood pressure.  Body temperature.  Skin for abnormal spots. Counseling Your health care provider may ask you questions about your:  Past medical problems.  Family's medical history.  Alcohol, tobacco, and drug use.  Emotional well-being.  Home life and relationship well-being.  Sexual activity.  Diet, exercise, and sleep habits.  Work and work Statistician.  Access to firearms.  Method of birth control.  Menstrual cycle.  Pregnancy history. What immunizations do I need? Vaccines are usually given at various ages, according to a schedule. Your health care provider will recommend vaccines for you based on your age, medical history, and lifestyle or other factors, such as travel or where you work.   What tests do I need? Blood tests  Lipid and cholesterol levels. These may be checked every 5 years, or more often if you are over 64 years old.  Hepatitis C test.  Hepatitis B  test. Screening  Lung cancer screening. You may have this screening every year starting at age 50 if you have a 30-pack-year history of smoking and currently smoke or have quit within the past 15 years.  Colorectal cancer screening. ? All adults should have this screening starting at age 38 and continuing until age 37. ? Your health care provider may recommend screening at age 39 if you are at increased risk. ? You will have tests every 1-10 years, depending on your results and the type of screening test.  Diabetes screening. ? This is done by checking your blood sugar (glucose) after you have not eaten for a while (fasting). ? You may have this done every 1-3 years.  Mammogram. ? This may be done every 1-2 years. ? Talk with your health care provider about when you should start having regular mammograms. This may depend on whether you have a family history of breast cancer.  BRCA-related cancer screening. This may be done if you have a family history of breast, ovarian, tubal, or peritoneal cancers.  Pelvic exam and Pap test. ? This may be done every 3 years starting at age 56. ? Starting at age 62, this may be done every 5 years if you have a Pap test in combination with an HPV test. Other tests  STD (sexually transmitted disease) testing, if you are at risk.  Bone density scan. This is done to screen for osteoporosis. You may have this scan if you are at high risk for osteoporosis. Talk with your health care provider about your test results, treatment options, and if necessary,  the need for more tests. Follow these instructions at home: Eating and drinking  Eat a diet that includes fresh fruits and vegetables, whole grains, lean protein, and low-fat dairy products.  Take vitamin and mineral supplements as recommended by your health care provider.  Do not drink alcohol if: ? Your health care provider tells you not to drink. ? You are pregnant, may be pregnant, or are planning  to become pregnant.  If you drink alcohol: ? Limit how much you have to 0-1 drink a day. ? Be aware of how much alcohol is in your drink. In the [place] one drink equals one 12 oz bottle of beer (355 mL), one 5 oz glass of wine (148 mL), or one 1 oz glass of hard liquor (44 mL).   Lifestyle  Take daily care of your teeth and gums. Brush your teeth every morning and night with fluoride toothpaste. Floss one time each day.  Stay active. Exercise for at least 30 minutes 5 or more days each week.  Do not use any products that contain nicotine or tobacco, such as cigarettes, e-cigarettes, and chewing tobacco. If you need help quitting, ask your health care provider.  Do not use drugs.  If you are sexually active, practice safe sex. Use a condom or other form of protection to prevent STIs (sexually transmitted infections).  If you do not wish to become pregnant, use a form of birth control. If you plan to become pregnant, see your health care provider for a prepregnancy visit.  If told by your health care provider, take low-dose aspirin daily starting at age 50.  Find healthy ways to cope with stress, such as: ? Meditation, yoga, or listening to music. ? Journaling. ? Talking to a trusted person. ? Spending time with friends and family. Safety  Always wear your seat belt while driving or riding in a vehicle.  Do not drive: ? If you have been drinking alcohol. Do not ride with someone who has been drinking. ? When you are tired or distracted. ? While texting.  Wear a helmet and other protective equipment during sports activities.  If you have firearms in your house, make sure you follow all gun safety procedures. What's next?  Visit your health care provider once a year for an annual wellness visit.  Ask your health care provider how often you should have your eyes and teeth checked.  Stay up to date on all vaccines. This information is not intended to replace advice given to you  by your health care provider. Make sure you discuss any questions you have with your health care provider. Document Revised: 04/15/2020 Document Reviewed: 03/23/2018 Elsevier Patient Education  2021 [employer]  

## 2020-12-17 NOTE — Addendum Note (Signed)
Addended by: Denna Haggard K on: 12/17/2020 09:00 AM   Modules accepted: Orders

## 2020-12-17 NOTE — Progress Notes (Signed)
Established Patient Office Visit     This visit occurred during the SARS-CoV-2 public health emergency.  Safety protocols were in place, including screening questions prior to the visit, additional usage of staff PPE, and extensive cleaning of exam room while observing appropriate contact time as indicated for disinfecting solutions.    CC/Reason for Visit: Annual preventive exam  HPI: Kimberly Walker is a 55 y.o. female who is coming in today for the above mentioned reasons. Past Medical History is significant for: Hypertension that has been well controlled on hydrochlorothiazide.  She has a prior history of iron deficiency.  She has otherwise been doing well and has no complaints.  She has not had routine medical care in some time.  She had a colonoscopy in 2018 that was incomplete due to poor prep and repeat colonoscopy was recommended.  She is overdue for mammogram.  She had a complete hysterectomy and was told by her GYN that she no longer needed Pap smears.  She is due for her third and fourth COVID vaccines, for shingles vaccinations.  She is concerned that she might have diabetes.  She has a strong family history of it.  She has been complaining of increased hunger and thirst.   Past Medical/Surgical History: Past Medical History:  Diagnosis Date  . Abnormal uterine bleeding    s/p total hysterectomy - benign pathology  . Anemia   . Frequent headaches   . Hyperlipidemia   . Hypertension   . Migraine   . Seizure [facility])    accompany migraines-last seizure 2011    Past Surgical History:  Procedure Laterality Date  . CESAREAN SECTION  1988  . CYSTOSCOPY N/A 02/10/2016   Procedure: CYSTOSCOPY;  Surgeon: Nunzio Cobbs, MD;  Location: [facility];  Service: Gynecology;  Laterality: N/A;  . ROBOTIC ASSISTED TOTAL HYSTERECTOMY WITH BILATERAL SALPINGO OOPHERECTOMY Bilateral 02/10/2016   Procedure: ROBOTIC ASSISTED TOTAL HYSTERECTOMY WITH BILATERAL SALPINGO OOPHORECTOMY AND  COLLECTION OF PELVIC WASHINGS;  Surgeon: Nunzio Cobbs, MD;  Location: [facility];  Service: Gynecology;  Laterality: Bilateral;  . TUBAL LIGATION      Social History:  reports that she has never smoked. She has never used smokeless tobacco. She reports that she does not drink alcohol and does not use drugs.  Allergies: No Known Allergies  Family History:  Family History  Problem Relation Age of Onset  . Diabetes Mother   . Hypertension Mother      Current Outpatient Medications:  .  ferrous sulfate 325 (65 FE) MG tablet, Take 325 mg by mouth daily with breakfast., Disp: , Rfl:  .  hydrochlorothiazide (HYDRODIURIL) 25 MG tablet, TAKE 1 TABLET BY MOUTH EVERY DAY, Disp: 90 tablet, Rfl: 0  Current Facility-Administered Medications:  .  0.9 %  sodium chloride infusion, 500 mL, Intravenous, Continuous, Armbruster, Carlota Raspberry, MD  Review of Systems:  Constitutional: Denies fever, chills, diaphoresis, appetite change and fatigue.  HEENT: Denies photophobia, eye pain, redness, hearing loss, ear pain, congestion, sore throat, rhinorrhea, sneezing, mouth sores, trouble swallowing, neck pain, neck stiffness and tinnitus.   Respiratory: Denies SOB, DOE, cough, chest tightness,  and wheezing.   Cardiovascular: Denies chest pain, palpitations and leg swelling.  Gastrointestinal: Denies nausea, vomiting, abdominal pain, diarrhea, constipation, blood in stool and abdominal distention.  Genitourinary: Denies dysuria, urgency, frequency, hematuria, flank pain and difficulty urinating.  Endocrine: Denies: hot or cold intolerance, sweats, changes in hair or nails, polyuria, polydipsia. Musculoskeletal: Denies  myalgias, back pain, joint swelling, arthralgias and gait problem.  Skin: Denies pallor, rash and wound.  Neurological: Denies dizziness, seizures, syncope, weakness, light-headedness, numbness and headaches.  Hematological: Denies adenopathy. Easy bruising, personal or family bleeding  history  Psychiatric/Behavioral: Denies suicidal ideation, mood changes, confusion, nervousness, sleep disturbance and agitation    Physical Exam: Vitals:   12/17/20 0817  BP: 130/86  Pulse: 82  Temp: 98.2 F (36.8 C)  TempSrc: Oral  SpO2: 95%  Weight: 178 lb 11.2 oz (81.1 kg)    Body mass index is 29.74 kg/m.   Constitutional: NAD, calm, comfortable Eyes: PERRL, lids and conjunctivae normal ENMT: Mucous membranes are moist. Posterior pharynx clear of any exudate or lesions. Normal dentition. Tympanic membrane is pearly white, no erythema or bulging. Neck: normal, supple, no masses, no thyromegaly Respiratory: clear to auscultation bilaterally, no wheezing, no crackles. Normal respiratory effort. No accessory muscle use.  Cardiovascular: Regular rate and rhythm, no murmurs / rubs / gallops. No extremity edema. 2+ pedal pulses. No carotid bruits.  Abdomen: no tenderness, no masses palpated. No hepatosplenomegaly. Bowel sounds positive.  Musculoskeletal: no clubbing / cyanosis. No joint deformity upper and lower extremities. Good ROM, no contractures. Normal muscle tone.  Skin: no rashes, lesions, ulcers. No induration Neurologic: CN 2-12 grossly intact. Sensation intact, DTR normal. Strength 5/5 in all 4.  Psychiatric: Normal judgment and insight. Alert and oriented x 3. Normal mood.    Impression and Plan:  Encounter for preventive health examination  - Plan: CBC with Differential/Platelet, Comprehensive metabolic panel, Lipid panel, TSH, Vitamin B12, VITAMIN D 25 Hydroxy (Vit-D Deficiency, Fractures) -Advised routine eye and dental care. -First shingles vaccine today, she is due for her third and fourth boosters of COVID. -Screening labs today. -Healthy lifestyle discussed in detail. -Mammogram requested. -No further need for Pap smears. -She had an incomplete colonoscopy in 2018 and a repeat was recommended, GI referral.  Screening mammogram for breast cancer  - Plan:  MM Digital Screening  Increased appetite -A1c in office today is 5.4, no diabetes. -Her weight has remained stable since last visit.  Screening for malignant neoplasm of colon  - Plan: Ambulatory referral to Gastroenterology  Need for shingles vaccine -First shingles vaccine administered today.   Patient Instructions   -Nice seeing you today!!  -Lab work today; will notify you once results are available.  -First shingles vaccine today.  -Remember your 3rd and 4th COVID vaccines.  -Schedule follow up in 6 months.   Preventive Care 20-42 Years Old, Female Preventive care refers to lifestyle choices and visits with your health care provider that can promote health and wellness. This includes:  A yearly physical exam. This is also called an annual wellness visit.  Regular dental and eye exams.  Immunizations.  Screening for certain conditions.  Healthy lifestyle choices, such as: ? Eating a healthy diet. ? Getting regular exercise. ? Not using drugs or products that contain nicotine and tobacco. ? Limiting alcohol use. What can I expect for my preventive care visit? Physical exam Your health care provider will check your:  Height and weight. These may be used to calculate your BMI (body mass index). BMI is a measurement that tells if you are at a healthy weight.  Heart rate and blood pressure.  Body temperature.  Skin for abnormal spots. Counseling Your health care provider may ask you questions about your:  Past medical problems.  Family's medical history.  Alcohol, tobacco, and drug use.  Emotional well-being.  Home  life and relationship well-being.  Sexual activity.  Diet, exercise, and sleep habits.  Work and work Statistician.  Access to firearms.  Method of birth control.  Menstrual cycle.  Pregnancy history. What immunizations do I need? Vaccines are usually given at various ages, according to a schedule. Your health care provider will  recommend vaccines for you based on your age, medical history, and lifestyle or other factors, such as travel or where you work.   What tests do I need? Blood tests  Lipid and cholesterol levels. These may be checked every 5 years, or more often if you are over 34 years old.  Hepatitis C test.  Hepatitis B test. Screening  Lung cancer screening. You may have this screening every year starting at age 50 if you have a 30-pack-year history of smoking and currently smoke or have quit within the past 15 years.  Colorectal cancer screening. ? All adults should have this screening starting at age 97 and continuing until age 37. ? Your health care provider may recommend screening at age 83 if you are at increased risk. ? You will have tests every 1-10 years, depending on your results and the type of screening test.  Diabetes screening. ? This is done by checking your blood sugar (glucose) after you have not eaten for a while (fasting). ? You may have this done every 1-3 years.  Mammogram. ? This may be done every 1-2 years. ? Talk with your health care provider about when you should start having regular mammograms. This may depend on whether you have a family history of breast cancer.  BRCA-related cancer screening. This may be done if you have a family history of breast, ovarian, tubal, or peritoneal cancers.  Pelvic exam and Pap test. ? This may be done every 3 years starting at age 8. ? Starting at age 17, this may be done every 5 years if you have a Pap test in combination with an HPV test. Other tests  STD (sexually transmitted disease) testing, if you are at risk.  Bone density scan. This is done to screen for osteoporosis. You may have this scan if you are at high risk for osteoporosis. Talk with your health care provider about your test results, treatment options, and if necessary, the need for more tests. Follow these instructions at home: Eating and drinking  Eat a diet  that includes fresh fruits and vegetables, whole grains, lean protein, and low-fat dairy products.  Take vitamin and mineral supplements as recommended by your health care provider.  Do not drink alcohol if: ? Your health care provider tells you not to drink. ? You are pregnant, may be pregnant, or are planning to become pregnant.  If you drink alcohol: ? Limit how much you have to 0-1 drink a day. ? Be aware of how much alcohol is in your drink. In the [place] one drink equals one 12 oz bottle of beer (355 mL), one 5 oz glass of wine (148 mL), or one 1 oz glass of hard liquor (44 mL).   Lifestyle  Take daily care of your teeth and gums. Brush your teeth every morning and night with fluoride toothpaste. Floss one time each day.  Stay active. Exercise for at least 30 minutes 5 or more days each week.  Do not use any products that contain nicotine or tobacco, such as cigarettes, e-cigarettes, and chewing tobacco. If you need help quitting, ask your health care provider.  Do not use drugs.  If  you are sexually active, practice safe sex. Use a condom or other form of protection to prevent STIs (sexually transmitted infections).  If you do not wish to become pregnant, use a form of birth control. If you plan to become pregnant, see your health care provider for a prepregnancy visit.  If told by your health care provider, take low-dose aspirin daily starting at age 24.  Find healthy ways to cope with stress, such as: ? Meditation, yoga, or listening to music. ? Journaling. ? Talking to a trusted person. ? Spending time with friends and family. Safety  Always wear your seat belt while driving or riding in a vehicle.  Do not drive: ? If you have been drinking alcohol. Do not ride with someone who has been drinking. ? When you are tired or distracted. ? While texting.  Wear a helmet and other protective equipment during sports activities.  If you have firearms in your house, make  sure you follow all gun safety procedures. What's next?  Visit your health care provider once a year for an annual wellness visit.  Ask your health care provider how often you should have your eyes and teeth checked.  Stay up to date on all vaccines. This information is not intended to replace advice given to you by your health care provider. Make sure you discuss any questions you have with your health care provider. Document Revised: 04/15/2020 Document Reviewed: 03/23/2018 Elsevier Patient Education  2021 Opp, MD [facility]

## 2021-01-02 ENCOUNTER — Other Ambulatory Visit: Payer: Self-pay | Admitting: Internal Medicine

## 2021-04-16 ENCOUNTER — Telehealth: Payer: Self-pay | Admitting: Gastroenterology

## 2021-04-16 NOTE — Telephone Encounter (Signed)
Spoke with patient, she is aware that we will check with Dr. Havery Moros and get back with her next week once he returns.   Dr. Havery Moros, patient had an incomplete colonoscopy in 2018 and needs repeat with a 2-day prep. OK to schedule direct or does she need an OV? Please advise, thanks

## 2021-04-19 NOTE — Telephone Encounter (Signed)
Yes okay to direct book colonoscopy at the [facility] with 2 day prep if she otherwise meets criteria. [employer]

## 2021-04-21 ENCOUNTER — Encounter: Payer: Self-pay | Admitting: Gastroenterology

## 2021-05-20 ENCOUNTER — Telehealth: Payer: Self-pay

## 2021-05-20 ENCOUNTER — Ambulatory Visit: Payer: BLUE CROSS/BLUE SHIELD

## 2021-05-20 NOTE — Telephone Encounter (Signed)
Multiple attempts made to reach patient concerning PV appt for today; patient was also left messages about her insurance not being accepted at the office ([place]); patient was asked to call back to the office to inform us if she wishes to proceed with procedure as scheduled with a PV needing to be rescheduled due to missed appt today OR if she wishes to cancel both the PV and the procedure appts; a no show letter will be sent to the patient if she does not call back by EOB today;

## 2021-06-03 ENCOUNTER — Encounter: Payer: BLUE CROSS/BLUE SHIELD | Admitting: Gastroenterology

## 2021-06-23 ENCOUNTER — Ambulatory Visit (INDEPENDENT_AMBULATORY_CARE_PROVIDER_SITE_OTHER): Payer: BLUE CROSS/BLUE SHIELD | Admitting: Internal Medicine

## 2021-06-23 VITALS — BP 130/80 | HR 85 | Temp 98.6°F | Wt 187.5 lb

## 2021-06-23 DIAGNOSIS — Z1231 Encounter for screening mammogram for malignant neoplasm of breast: Secondary | ICD-10-CM

## 2021-06-23 DIAGNOSIS — I10 Essential (primary) hypertension: Secondary | ICD-10-CM | POA: Diagnosis not present

## 2021-06-23 DIAGNOSIS — Z1211 Encounter for screening for malignant neoplasm of colon: Secondary | ICD-10-CM

## 2021-06-23 DIAGNOSIS — Z23 Encounter for immunization: Secondary | ICD-10-CM

## 2021-06-23 NOTE — Patient Instructions (Signed)
-  Nice seeing you today!!  -Shingles and flu vaccine today.  -Remember your COVID booster at the pharmacy.  -Mammogram and colonoscopy referrals today.  -Schedule follow up for your physical in 6 months. Please come in fasting that day.

## 2021-06-23 NOTE — Progress Notes (Signed)
Established Patient Office Visit     This visit occurred during the SARS-CoV-2 public health emergency.  Safety protocols were in place, including screening questions prior to the visit, additional usage of staff PPE, and extensive cleaning of exam room while observing appropriate contact time as indicated for disinfecting solutions.    CC/Reason for Visit: 42-month follow-up chronic medical conditions  HPI: Kimberly Walker is a 55 y.o. female who is coming in today for the above mentioned reasons. Past Medical History is significant for: Hypertension, iron deficiency anemia.  She has been doing well since I last saw her and has no acute concerns.  She is requesting her second shingles and flu vaccines today.  She is also due for COVID booster.  She has not yet had her mammogram or colonoscopy.   Past Medical/Surgical History: Past Medical History:  Diagnosis Date   Abnormal uterine bleeding    s/p total hysterectomy - benign pathology   Anemia    Frequent headaches    Hyperlipidemia    Hypertension    Migraine    Seizure ([place])    accompany migraines-last seizure 2011    Past Surgical History:  Procedure Laterality Date   [place] N/A 02/10/2016   Procedure: CYSTOSCOPY;  Surgeon: Nunzio Cobbs, MD;  Location: [facility];  Service: Gynecology;  Laterality: N/A;   ROBOTIC ASSISTED TOTAL HYSTERECTOMY WITH BILATERAL SALPINGO OOPHERECTOMY Bilateral 02/10/2016   Procedure: ROBOTIC ASSISTED TOTAL HYSTERECTOMY WITH BILATERAL SALPINGO OOPHORECTOMY AND COLLECTION OF PELVIC WASHINGS;  Surgeon: Nunzio Cobbs, MD;  Location: [facility];  Service: Gynecology;  Laterality: Bilateral;   TUBAL LIGATION      Social History:  reports that she has never smoked. She has never used smokeless tobacco. She reports that she does not drink alcohol and does not use drugs.  Allergies: No Known Allergies  Family History:  Family History  Problem Relation  Age of Onset   Diabetes Mother    Hypertension Mother      Current Outpatient Medications:    ferrous sulfate 325 (65 FE) MG tablet, Take 325 mg by mouth daily with breakfast., Disp: , Rfl:    hydrochlorothiazide (HYDRODIURIL) 25 MG tablet, TAKE 1 TABLET BY MOUTH EVERY DAY, Disp: 90 tablet, Rfl: 1  Current Facility-Administered Medications:    0.9 %  sodium chloride infusion, 500 mL, Intravenous, Continuous, Armbruster, Carlota Raspberry, MD  Review of Systems:  Constitutional: Denies fever, chills, diaphoresis, appetite change and fatigue.  HEENT: Denies photophobia, eye pain, redness, hearing loss, ear pain, congestion, sore throat, rhinorrhea, sneezing, mouth sores, trouble swallowing, neck pain, neck stiffness and tinnitus.   Respiratory: Denies SOB, DOE, cough, chest tightness,  and wheezing.   Cardiovascular: Denies chest pain, palpitations and leg swelling.  Gastrointestinal: Denies nausea, vomiting, abdominal pain, diarrhea, constipation, blood in stool and abdominal distention.  Genitourinary: Denies dysuria, urgency, frequency, hematuria, flank pain and difficulty urinating.  Endocrine: Denies: hot or cold intolerance, sweats, changes in hair or nails, polyuria, polydipsia. Musculoskeletal: Denies myalgias, back pain, joint swelling, arthralgias and gait problem.  Skin: Denies pallor, rash and wound.  Neurological: Denies dizziness, seizures, syncope, weakness, light-headedness, numbness and headaches.  Hematological: Denies adenopathy. Easy bruising, personal or family bleeding history  Psychiatric/Behavioral: Denies suicidal ideation, mood changes, confusion, nervousness, sleep disturbance and agitation    Physical Exam: Vitals:   06/23/21 0719  BP: 130/80  Pulse: 85  Temp: 98.6 F (37 C)  TempSrc: Oral  SpO2: 98%  Weight: 187 lb 8 oz (85 kg)    Body mass index is 31.2 kg/m.   Constitutional: NAD, calm, comfortable Eyes: PERRL, lids and conjunctivae normal, wears  corrective lenses ENMT: Mucous membranes are moist.  Respiratory: clear to auscultation bilaterally, no wheezing, no crackles. Normal respiratory effort. No accessory muscle use.  Cardiovascular: Regular rate and rhythm, no murmurs / rubs / gallops. No extremity edema.  Neurologic: Grossly intact and nonfocal Psychiatric: Normal judgment and insight. Alert and oriented x 3. Normal mood.    Impression and Plan:  Essential hypertension -Blood pressure is well controlled.  Continue hydrochlorothiazide 25 mg daily.  Screening for malignant neoplasm of colon  - Plan: Ambulatory referral to Gastroenterology  Encounter for screening mammogram for malignant neoplasm of breast  - Plan: MM Digital Screening  Need for influenza vaccination -Flu vaccine administered today.  Need for shingles vaccine -Second shingles vaccine administered today.  Time spent: 31 minutes reviewing chart, interviewing and examining patient and formulating plan of care.   Patient Instructions  -Nice seeing you today!!  -Shingles and flu vaccine today.  -Remember your COVID booster at the pharmacy.  -Mammogram and colonoscopy referrals today.  -Schedule follow up for your physical in 6 months. Please come in fasting that day.    Lelon Frohlich, MD [facility] at [facility]

## 2021-06-23 NOTE — Addendum Note (Signed)
Addended by: Westley Hummer B on: 06/23/2021 11:07 AM   Modules accepted: Orders

## 2021-06-29 ENCOUNTER — Other Ambulatory Visit: Payer: Self-pay | Admitting: Internal Medicine

## 2021-07-01 ENCOUNTER — Other Ambulatory Visit: Payer: Self-pay | Admitting: Internal Medicine

## 2021-07-01 DIAGNOSIS — Z1231 Encounter for screening mammogram for malignant neoplasm of breast: Secondary | ICD-10-CM

## 2021-08-03 ENCOUNTER — Ambulatory Visit: Payer: BLUE CROSS/BLUE SHIELD

## 2021-08-18 ENCOUNTER — Ambulatory Visit: Payer: BLUE CROSS/BLUE SHIELD

## 2021-09-07 LAB — HM MAMMOGRAPHY

## 2021-09-09 ENCOUNTER — Encounter: Payer: Self-pay | Admitting: Internal Medicine

## 2021-10-03 ENCOUNTER — Other Ambulatory Visit: Payer: Self-pay | Admitting: Internal Medicine

## 2022-03-15 LAB — HM COLONOSCOPY

## 2022-03-16 ENCOUNTER — Encounter: Payer: Self-pay | Admitting: Internal Medicine

## 2022-03-18 ENCOUNTER — Encounter: Payer: Self-pay | Admitting: Internal Medicine

## 2022-04-11 ENCOUNTER — Other Ambulatory Visit: Payer: Self-pay | Admitting: Internal Medicine

## 2022-12-29 ENCOUNTER — Other Ambulatory Visit: Payer: Self-pay | Admitting: Internal Medicine

## 2022-12-30 ENCOUNTER — Telehealth: Payer: Self-pay | Admitting: Internal Medicine

## 2022-12-30 MED ORDER — HYDROCHLOROTHIAZIDE 25 MG PO TABS: 25.0000 mg | ORAL_TABLET | Freq: Every day | ORAL | 1 refills | Status: DC

## 2022-12-30 NOTE — Telephone Encounter (Signed)
Prescription Request  12/30/2022  LOV: Visit date not found. Pt has appt on 02-07-2023  What is the name of the medication or equipment? hydrochlorothiazide (HYDRODIURIL) 25 MG tablet   Have you contacted your pharmacy to request a refill? No   Which pharmacy would you like this sent to?   [id number] - [place], [place] - [address] AT [facility] OF [address] & [address] Phone: [phone]  Fax: [phone]      Patient notified that their request is being sent to the clinical staff for review and that they should receive a response within 2 business days.   Please advise at Mobile [phone] (mobile)

## 2023-02-05 ENCOUNTER — Other Ambulatory Visit: Payer: Self-pay | Admitting: Internal Medicine

## 2023-02-07 ENCOUNTER — Encounter: Payer: Self-pay | Admitting: Internal Medicine

## 2023-02-07 ENCOUNTER — Ambulatory Visit (INDEPENDENT_AMBULATORY_CARE_PROVIDER_SITE_OTHER): Payer: 59 | Admitting: Internal Medicine

## 2023-02-07 VITALS — BP 120/80 | HR 75 | Temp 98.2°F | Wt 170.3 lb

## 2023-02-07 DIAGNOSIS — D509 Iron deficiency anemia, unspecified: Secondary | ICD-10-CM | POA: Diagnosis not present

## 2023-02-07 DIAGNOSIS — I10 Essential (primary) hypertension: Secondary | ICD-10-CM | POA: Diagnosis not present

## 2023-02-07 LAB — CBC WITH DIFFERENTIAL/PLATELET
Basophils Absolute: 0 10*3/uL (ref 0.0–0.1)
Basophils Relative: 0.6 % (ref 0.0–3.0)
Eosinophils Absolute: 0.1 10*3/uL (ref 0.0–0.7)
Eosinophils Relative: 1.4 % (ref 0.0–5.0)
HCT: 36.6 % (ref 36.0–46.0)
Hemoglobin: 11.9 g/dL — ABNORMAL LOW (ref 12.0–15.0)
Lymphocytes Relative: 33.2 % (ref 12.0–46.0)
Lymphs Abs: 2.2 10*3/uL (ref 0.7–4.0)
MCHC: 32.5 g/dL (ref 30.0–36.0)
MCV: 95.4 fl (ref 78.0–100.0)
Monocytes Absolute: 0.4 10*3/uL (ref 0.1–1.0)
Monocytes Relative: 6.2 % (ref 3.0–12.0)
Neutro Abs: 3.8 10*3/uL (ref 1.4–7.7)
Neutrophils Relative %: 58.6 % (ref 43.0–77.0)
Platelets: 274 10*3/uL (ref 150.0–400.0)
RBC: 3.83 Mil/uL — ABNORMAL LOW (ref 3.87–5.11)
RDW: 12.6 % (ref 11.5–15.5)
WBC: 6.5 10*3/uL (ref 4.0–10.5)

## 2023-02-07 LAB — COMPREHENSIVE METABOLIC PANEL
ALT: 20 U/L (ref 0–35)
AST: 31 U/L (ref 0–37)
Albumin: 4.4 g/dL (ref 3.5–5.2)
Alkaline Phosphatase: 99 U/L (ref 39–117)
BUN: 23 mg/dL (ref 6–23)
CO2: 30 mEq/L (ref 19–32)
Calcium: 10.1 mg/dL (ref 8.4–10.5)
Chloride: 101 mEq/L (ref 96–112)
Creatinine, Ser: 1.04 mg/dL (ref 0.40–1.20)
GFR: 59.1 mL/min — ABNORMAL LOW (ref >60.00)
Glucose, Bld: 96 mg/dL (ref 70–99)
Potassium: 4 mEq/L (ref 3.5–5.1)
Sodium: 140 mEq/L (ref 135–145)
Total Bilirubin: 0.4 mg/dL (ref 0.2–1.2)
Total Protein: 7.7 g/dL (ref 6.0–8.3)

## 2023-02-07 MED ORDER — HYDROCHLOROTHIAZIDE 25 MG PO TABS: 25.0000 mg | ORAL_TABLET | Freq: Every day | ORAL | 1 refills | Status: DC

## 2023-02-07 NOTE — Assessment & Plan Note (Signed)
Well-controlled continue hydrochlorothiazide 25 mg daily.  Check renal function and electrolytes.

## 2023-02-07 NOTE — Progress Notes (Signed)
Established Patient Office Visit     CC/Reason for Visit: Follow-up chronic conditions, medication refills  HPI: Kimberly Walker is a 57 y.o. female who is coming in today for the above mentioned reasons. Past Medical History is significant for: Hypertension, iron deficiency anemia and bilateral carpal tunnel.  I have not seen her since 2022.  Medication refills were denied.  She has been feeling well other than recurrence of her carpal tunnel.  She is now wearing her wrist splints again.  She is not willing to consider yet to referral to specialist for management.   Past Medical/Surgical History: Past Medical History:  Diagnosis Date   Abnormal uterine bleeding    s/p total hysterectomy - benign pathology   Anemia    Frequent headaches    Hyperlipidemia    Hypertension    Migraine    Seizure ([facility])    accompany migraines-last seizure 2011    Past Surgical History:  Procedure Laterality Date   CESAREAN SECTION  1988   CYSTOSCOPY N/A 02/10/2016   Procedure: CYSTOSCOPY;  Surgeon: Patton Salles, MD;  Location: [facility];  Service: Gynecology;  Laterality: N/A;   ROBOTIC ASSISTED TOTAL HYSTERECTOMY WITH BILATERAL SALPINGO OOPHERECTOMY Bilateral 02/10/2016   Procedure: ROBOTIC ASSISTED TOTAL HYSTERECTOMY WITH BILATERAL SALPINGO OOPHORECTOMY AND COLLECTION OF PELVIC WASHINGS;  Surgeon: Patton Salles, MD;  Location: [facility];  Service: Gynecology;  Laterality: Bilateral;   TUBAL LIGATION      Social History:  reports that she has never smoked. She has never used smokeless tobacco. She reports that she does not drink alcohol and does not use drugs.  Allergies: No Known Allergies  Family History:  Family History  Problem Relation Age of Onset   Diabetes Mother    Hypertension Mother      Current Outpatient Medications:    ascorbic acid (VITAMIN C) 500 MG tablet, Take 500 mg by mouth daily., Disp: , Rfl:    cholecalciferol (VITAMIN D3) 25 MCG (1000 UNIT)  tablet, Take 1,000 Units by mouth daily., Disp: , Rfl:    ferrous sulfate 325 (65 FE) MG tablet, Take 325 mg by mouth daily with breakfast., Disp: , Rfl:    hydrochlorothiazide (HYDRODIURIL) 25 MG tablet, Take 1 tablet (25 mg total) by mouth daily., Disp: 90 tablet, Rfl: 1  Current Facility-Administered Medications:    0.9 %  sodium chloride infusion, 500 mL, Intravenous, Continuous, Armbruster, Willaim Rayas, MD  Review of Systems:  Negative unless indicated in HPI.   Physical Exam: Vitals:   02/07/23 0855  BP: 120/80  Pulse: 75  Temp: 98.2 F (36.8 C)  TempSrc: Oral  SpO2: 98%  Weight: 170 lb 4.8 oz (77.2 kg)    Body mass index is 28.34 kg/m.   Physical Exam Vitals reviewed.  Constitutional:      Appearance: Normal appearance.  HENT:     Head: Normocephalic and atraumatic.  Eyes:     Conjunctiva/sclera: Conjunctivae normal.     Pupils: Pupils are equal, round, and reactive to light.  Cardiovascular:     Rate and Rhythm: Normal rate and regular rhythm.  Pulmonary:     Effort: Pulmonary effort is normal.     Breath sounds: Normal breath sounds.  Skin:    General: Skin is warm and dry.  Neurological:     General: No focal deficit present.     Mental Status: She is alert and oriented to person, place, and time.  Psychiatric:  Mood and Affect: Mood normal.        Behavior: Behavior normal.        Thought Content: Thought content normal.        Judgment: Judgment normal.      Impression and Plan:  Essential hypertension Assessment & Plan: Well-controlled continue hydrochlorothiazide 25 mg daily.  Check renal function and electrolytes.  Orders: -     hydroCHLOROthiazide; Take 1 tablet (25 mg total) by mouth daily.  Dispense: 90 tablet; Refill: 1 -     CBC with Differential/Platelet; Future -     Comprehensive metabolic panel; Future  Iron deficiency anemia, unspecified iron deficiency anemia type Assessment & Plan: Check hemoglobin today.  Colonoscopy  was done in February 2023    -She will schedule follow-up for annual preventive exam.  Time spent:30 minutes reviewing chart, interviewing and examining patient and formulating plan of care.     Chaya Jan, MD [facility] at [facility]

## 2023-02-07 NOTE — Assessment & Plan Note (Signed)
Check hemoglobin today.  Colonoscopy was done in February 2023

## 2023-03-14 ENCOUNTER — Other Ambulatory Visit: Payer: Self-pay | Admitting: Internal Medicine

## 2023-03-14 DIAGNOSIS — I10 Essential (primary) hypertension: Secondary | ICD-10-CM

## 2023-06-18 ENCOUNTER — Other Ambulatory Visit: Payer: Self-pay | Admitting: Internal Medicine

## 2023-06-18 DIAGNOSIS — I10 Essential (primary) hypertension: Secondary | ICD-10-CM

## 2023-09-11 ENCOUNTER — Other Ambulatory Visit: Payer: Self-pay | Admitting: Internal Medicine

## 2023-09-11 DIAGNOSIS — I10 Essential (primary) hypertension: Secondary | ICD-10-CM

## 2023-09-12 MED ORDER — HYDROCHLOROTHIAZIDE 25 MG PO TABS: 25.0000 mg | ORAL_TABLET | Freq: Every day | ORAL | 1 refills | Status: DC

## 2023-10-13 ENCOUNTER — Ambulatory Visit (INDEPENDENT_AMBULATORY_CARE_PROVIDER_SITE_OTHER): Payer: Self-pay | Admitting: Internal Medicine

## 2023-10-13 ENCOUNTER — Encounter: Payer: Self-pay | Admitting: Internal Medicine

## 2023-10-13 VITALS — BP 120/80 | HR 64 | Temp 97.9°F | Ht 65.0 in | Wt 170.5 lb

## 2023-10-13 DIAGNOSIS — I10 Essential (primary) hypertension: Secondary | ICD-10-CM | POA: Diagnosis not present

## 2023-10-13 DIAGNOSIS — Z23 Encounter for immunization: Secondary | ICD-10-CM | POA: Diagnosis not present

## 2023-10-13 DIAGNOSIS — Z1231 Encounter for screening mammogram for malignant neoplasm of breast: Secondary | ICD-10-CM

## 2023-10-13 DIAGNOSIS — Z0001 Encounter for general adult medical examination with abnormal findings: Secondary | ICD-10-CM

## 2023-10-13 DIAGNOSIS — D509 Iron deficiency anemia, unspecified: Secondary | ICD-10-CM | POA: Diagnosis not present

## 2023-10-13 DIAGNOSIS — R252 Cramp and spasm: Secondary | ICD-10-CM | POA: Diagnosis not present

## 2023-10-13 DIAGNOSIS — Z0000 Encounter for general adult medical examination without abnormal findings: Secondary | ICD-10-CM

## 2023-10-13 LAB — COMPREHENSIVE METABOLIC PANEL
ALT: 14 U/L (ref 0–35)
AST: 20 U/L (ref 0–37)
Albumin: 4.4 g/dL (ref 3.5–5.2)
Alkaline Phosphatase: 89 U/L (ref 39–117)
BUN: 24 mg/dL — ABNORMAL HIGH (ref 6–23)
CO2: 33 meq/L — ABNORMAL HIGH (ref 19–32)
Calcium: 9.7 mg/dL (ref 8.4–10.5)
Chloride: 102 meq/L (ref 96–112)
Creatinine, Ser: 0.94 mg/dL (ref 0.40–1.20)
GFR: 66.41 mL/min (ref >60.00)
Glucose, Bld: 104 mg/dL — ABNORMAL HIGH (ref 70–99)
Potassium: 4 meq/L (ref 3.5–5.1)
Sodium: 142 meq/L (ref 135–145)
Total Bilirubin: 0.4 mg/dL (ref 0.2–1.2)
Total Protein: 7.5 g/dL (ref 6.0–8.3)

## 2023-10-13 LAB — CBC WITH DIFFERENTIAL/PLATELET
Basophils Absolute: 0 10*3/uL (ref 0.0–0.1)
Basophils Relative: 0.7 % (ref 0.0–3.0)
Eosinophils Absolute: 0.1 10*3/uL (ref 0.0–0.7)
Eosinophils Relative: 1.9 % (ref 0.0–5.0)
HCT: 34.1 % — ABNORMAL LOW (ref 36.0–46.0)
Hemoglobin: 11.5 g/dL — ABNORMAL LOW (ref 12.0–15.0)
Lymphocytes Relative: 32.8 % (ref 12.0–46.0)
Lymphs Abs: 2 10*3/uL (ref 0.7–4.0)
MCHC: 33.7 g/dL (ref 30.0–36.0)
MCV: 94.6 fl (ref 78.0–100.0)
Monocytes Absolute: 0.3 10*3/uL (ref 0.1–1.0)
Monocytes Relative: 5.6 % (ref 3.0–12.0)
Neutro Abs: 3.6 10*3/uL (ref 1.4–7.7)
Neutrophils Relative %: 59 % (ref 43.0–77.0)
Platelets: 282 10*3/uL (ref 150.0–400.0)
RBC: 3.6 Mil/uL — ABNORMAL LOW (ref 3.87–5.11)
RDW: 12.7 % (ref 11.5–15.5)
WBC: 6.1 10*3/uL (ref 4.0–10.5)

## 2023-10-13 LAB — LIPID PANEL
Cholesterol: 210 mg/dL — ABNORMAL HIGH (ref 0–200)
HDL: 76.3 mg/dL (ref >39.00)
LDL Cholesterol: 126 mg/dL — ABNORMAL HIGH (ref 0–99)
NonHDL: 133.89
Total CHOL/HDL Ratio: 3
Triglycerides: 39 mg/dL (ref 0.0–149.0)
VLDL: 7.8 mg/dL (ref 0.0–40.0)

## 2023-10-13 LAB — VITAMIN D 25 HYDROXY (VIT D DEFICIENCY, FRACTURES): VITD: 87.36 ng/mL (ref 30.00–100.00)

## 2023-10-13 LAB — VITAMIN B12: Vitamin B-12: 733 pg/mL (ref 211–911)

## 2023-10-13 LAB — TSH: TSH: 1.41 u[IU]/mL (ref 0.35–5.50)

## 2023-10-13 LAB — MAGNESIUM: Magnesium: 2.2 mg/dL (ref 1.5–2.5)

## 2023-10-13 NOTE — Progress Notes (Signed)
 Established Patient Office Visit     CC/Reason for Visit: Annual preventive exam  HPI: Kimberly Walker is a 58 y.o. female who is coming in today for the above mentioned reasons. Past Medical History is significant for: Hypertension, iron deficiency anemia, bilateral carpal tunnels.  Feeling well without acute concern or complaint.  She has dentures so no longer visits the dentist, she is overdue for an eye exam.  She has been complaining of bilateral leg cramps.  She is overdue for mammogram and a Tdap.  Had a colonoscopy in 2023 and is a 10-year callback.   Past Medical/Surgical History: Past Medical History:  Diagnosis Date   Abnormal uterine bleeding    s/p total hysterectomy - benign pathology   Anemia    Frequent headaches    Hyperlipidemia    Hypertension    Migraine    Seizure (HCC)    accompany migraines-last seizure 2011    Past Surgical History:  Procedure Laterality Date   CESAREAN SECTION  1988   CYSTOSCOPY N/A 02/10/2016   Procedure: CYSTOSCOPY;  Surgeon: Patton Salles, MD;  Location: [facility];  Service: Gynecology;  Laterality: N/A;   ROBOTIC ASSISTED TOTAL HYSTERECTOMY WITH BILATERAL SALPINGO OOPHERECTOMY Bilateral 02/10/2016   Procedure: ROBOTIC ASSISTED TOTAL HYSTERECTOMY WITH BILATERAL SALPINGO OOPHORECTOMY AND COLLECTION OF PELVIC WASHINGS;  Surgeon: Patton Salles, MD;  Location: [facility];  Service: Gynecology;  Laterality: Bilateral;   TUBAL LIGATION      Social History:  reports that she has never smoked. She has never used smokeless tobacco. She reports that she does not drink alcohol and does not use drugs.  Allergies: No Known Allergies  Family History:  Family History  Problem Relation Age of Onset   Diabetes Mother    Hypertension Mother      Current Outpatient Medications:    ascorbic acid (VITAMIN C) 500 MG tablet, Take 500 mg by mouth daily., Disp: , Rfl:    cholecalciferol (VITAMIN D3) 25 MCG (1000 UNIT) tablet,  Take 1,000 Units by mouth daily., Disp: , Rfl:    ferrous sulfate 325 (65 FE) MG tablet, Take 325 mg by mouth daily with breakfast., Disp: , Rfl:    hydrochlorothiazide (HYDRODIURIL) 25 MG tablet, Take 1 tablet (25 mg total) by mouth daily., Disp: 90 tablet, Rfl: 1  Current Facility-Administered Medications:    0.9 %  sodium chloride infusion, 500 mL, Intravenous, Continuous, Armbruster, Willaim Rayas, MD  Review of Systems:  Negative unless indicated in HPI.   Physical Exam: Vitals:   10/13/23 0708  BP: 120/80  Pulse: 64  Temp: 97.9 F (36.6 C)  TempSrc: Oral  SpO2: 99%  Weight: 170 lb 8 oz (77.3 kg)  Height: 5\' 5"  (1.651 m)    Body mass index is 28.37 kg/m.   Physical Exam Vitals reviewed.  Constitutional:      General: She is not in acute distress.    Appearance: Normal appearance. She is not ill-appearing, toxic-appearing or diaphoretic.  HENT:     Head: Normocephalic.     Right Ear: Tympanic membrane, ear canal and external ear normal. There is no impacted cerumen.     Left Ear: Tympanic membrane, ear canal and external ear normal. There is no impacted cerumen.     Nose: Nose normal.     Mouth/Throat:     Mouth: Mucous membranes are moist.     Pharynx: Oropharynx is clear. No oropharyngeal exudate or posterior oropharyngeal erythema.  Eyes:     General: No scleral icterus.       Right eye: No discharge.        Left eye: No discharge.     Conjunctiva/sclera: Conjunctivae normal.     Pupils: Pupils are equal, round, and reactive to light.  Neck:     Vascular: No carotid bruit.  Cardiovascular:     Rate and Rhythm: Normal rate and regular rhythm.     Pulses: Normal pulses.     Heart sounds: Normal heart sounds.  Pulmonary:     Effort: Pulmonary effort is normal. No respiratory distress.     Breath sounds: Normal breath sounds.  Abdominal:     General: Abdomen is flat. Bowel sounds are normal.     Palpations: Abdomen is soft.  Musculoskeletal:        General:  Normal range of motion.     Cervical back: Normal range of motion.  Skin:    General: Skin is warm and dry.  Neurological:     General: No focal deficit present.     Mental Status: She is alert and oriented to person, place, and time. Mental status is at baseline.  Psychiatric:        Mood and Affect: Mood normal.        Behavior: Behavior normal.        Thought Content: Thought content normal.        Judgment: Judgment normal.     Flowsheet Row Office Visit from 10/13/2023 in [facility] [place]  PHQ-9 Total Score 0        Impression and Plan:  Encounter for preventive health examination  Essential hypertension -     CBC with Differential/Platelet; Future -     Comprehensive metabolic panel; Future -     Lipid panel; Future -     TSH; Future -     Vitamin B12; Future -     VITAMIN D 25 Hydroxy (Vit-D Deficiency, Fractures); Future  Iron deficiency anemia, unspecified iron deficiency anemia type  Encounter for screening mammogram for malignant neoplasm of breast -     Digital Screening Mammogram, Left and Right; Future  Immunization due  Leg cramps -     Magnesium; Future   -Recommend routine eye and dental care. -Healthy lifestyle discussed in detail. -Labs to be updated today. -Prostate cancer screening: N/A  Health Maintenance  Topic Date Due   COVID-19 Vaccine (3 - 2024-25 season) 03/27/2023   Mammogram  09/08/2023   Flu Shot  10/24/2023*   HIV Screening  09/07/2025*   DTaP/Tdap/Td vaccine (2 - Td or Tdap) 02/23/2024   Colon Cancer Screening  03/15/2032   Hepatitis C Screening  Completed   Zoster (Shingles) Vaccine  Completed   HPV Vaccine  Aged Out  *Topic was postponed. The date shown is not the original due date.     -Tdap administered in office today. -Mammogram requested. -Has had a total hysterectomy so Pap smear no longer needed.   Chaya Jan, MD [facility]

## 2023-10-13 NOTE — Addendum Note (Signed)
 Addended by: Trenton Gammon on: 10/13/2023 08:37 AM   Modules accepted: Orders

## 2024-03-12 ENCOUNTER — Other Ambulatory Visit: Payer: Self-pay | Admitting: Internal Medicine

## 2024-03-12 DIAGNOSIS — I10 Essential (primary) hypertension: Secondary | ICD-10-CM

## 2024-04-02 ENCOUNTER — Ambulatory Visit: Admitting: Internal Medicine

## 2024-04-02 ENCOUNTER — Encounter: Payer: Self-pay | Admitting: Internal Medicine

## 2024-04-02 VITALS — BP 130/70 | HR 66 | Temp 98.3°F | Wt 165.7 lb

## 2024-04-02 DIAGNOSIS — Z23 Encounter for immunization: Secondary | ICD-10-CM

## 2024-04-02 DIAGNOSIS — E782 Mixed hyperlipidemia: Secondary | ICD-10-CM

## 2024-04-02 DIAGNOSIS — I10 Essential (primary) hypertension: Secondary | ICD-10-CM | POA: Diagnosis not present

## 2024-04-02 LAB — LIPID PANEL
Cholesterol: 224 mg/dL — ABNORMAL HIGH (ref 0–200)
HDL: 77.4 mg/dL (ref >39.00)
LDL Cholesterol: 134 mg/dL — ABNORMAL HIGH (ref 0–99)
NonHDL: 147.08
Total CHOL/HDL Ratio: 3
Triglycerides: 64 mg/dL (ref 0.0–149.0)
VLDL: 12.8 mg/dL (ref 0.0–40.0)

## 2024-04-02 NOTE — Progress Notes (Signed)
 Established Patient Office Visit     CC/Reason for Visit: Follow-up cholesterol, requesting flu vaccine  HPI: Kimberly Walker is a 58 y.o. female who is coming in today for the above mentioned reasons. Past Medical History is significant for: Hypertension, hyperlipidemia and iron deficiency anemia.  Feeling well, no major concerns or complaints.  She was asked to work on lifestyle changes in hopes of lowering her cholesterol here today for follow-up.  She has lost 5 pounds since her last visit.  She is requesting a flu vaccine.   Past Medical/Surgical History: Past Medical History:  Diagnosis Date   Abnormal uterine bleeding    s/p total hysterectomy - benign pathology   Anemia    Frequent headaches    Hyperlipidemia    Hypertension    Migraine    Seizure (HCC)    accompany migraines-last seizure 2011    Past Surgical History:  Procedure Laterality Date   CESAREAN SECTION  1988   CYSTOSCOPY N/A 02/10/2016   Procedure: CYSTOSCOPY;  Surgeon: Bobie FORBES Cathlyn JAYSON Nikki, MD;  Location: [facility];  Service: Gynecology;  Laterality: N/A;   ROBOTIC ASSISTED TOTAL HYSTERECTOMY WITH BILATERAL SALPINGO OOPHERECTOMY Bilateral 02/10/2016   Procedure: ROBOTIC ASSISTED TOTAL HYSTERECTOMY WITH BILATERAL SALPINGO OOPHORECTOMY AND COLLECTION OF PELVIC WASHINGS;  Surgeon: Bobie FORBES Cathlyn JAYSON Nikki, MD;  Location: [facility];  Service: Gynecology;  Laterality: Bilateral;   TUBAL LIGATION      Social History:  reports that she has never smoked. She has never used smokeless tobacco. She reports that she does not drink alcohol and does not use drugs.  Allergies: No Known Allergies  Family History:  Family History  Problem Relation Age of Onset   Diabetes Mother    Hypertension Mother      Current Outpatient Medications:    ascorbic acid (VITAMIN C) 500 MG tablet, Take 500 mg by mouth daily., Disp: , Rfl:    cholecalciferol (VITAMIN D3) 25 MCG (1000 UNIT) tablet, Take 1,000 Units by mouth  daily., Disp: , Rfl:    ferrous sulfate 325 (65 FE) MG tablet, Take 325 mg by mouth daily with breakfast., Disp: , Rfl:    hydrochlorothiazide  (HYDRODIURIL ) 25 MG tablet, TAKE 1 TABLET(25 MG) BY MOUTH DAILY, Disp: 90 tablet, Rfl: 1  Current Facility-Administered Medications:    0.9 %  sodium chloride  infusion, 500 mL, Intravenous, Continuous, Armbruster, Elspeth SQUIBB, MD  Review of Systems:  Negative unless indicated in HPI.   Physical Exam: Vitals:   04/02/24 1018  BP: 130/70  Pulse: 66  Temp: 98.3 F (36.8 C)  TempSrc: Oral  SpO2: 99%  Weight: 165 lb 11.2 oz (75.2 kg)    Body mass index is 27.57 kg/m.   Physical Exam Vitals reviewed.  Constitutional:      Appearance: Normal appearance.  HENT:     Head: Normocephalic and atraumatic.  Eyes:     Conjunctiva/sclera: Conjunctivae normal.  Cardiovascular:     Rate and Rhythm: Normal rate and regular rhythm.  Pulmonary:     Effort: Pulmonary effort is normal.     Breath sounds: Normal breath sounds.  Skin:    General: Skin is warm and dry.  Neurological:     General: No focal deficit present.     Mental Status: She is alert and oriented to person, place, and time.  Psychiatric:        Mood and Affect: Mood normal.        Behavior: Behavior normal.  Thought Content: Thought content normal.        Judgment: Judgment normal.      Impression and Plan:  Essential hypertension  Mixed hyperlipidemia -     Lipid panel; Future  Immunization due   - Blood pressure is well-controlled on current. - Flu vaccine in office today. - Check lipids today.  Time spent:30 minutes reviewing chart, interviewing and examining patient and formulating plan of care.     Tully Theophilus Andrews, MD [facility]

## 2024-04-02 NOTE — Addendum Note (Signed)
 Addended by: KATHRYNE MILLMAN B on: 04/02/2024 11:46 AM   Modules accepted: Orders

## 2024-04-03 ENCOUNTER — Telehealth: Payer: Self-pay | Admitting: *Deleted

## 2024-04-03 NOTE — Telephone Encounter (Signed)
 Copied from CRM [id number]. Topic: Clinical - Medication Question >> Apr 03, 2024 12:21 PM [name] wrote: Reason for CRM:  Patient was seen yesterday regarding her high blood pressure, and she stated she left a note for Theophilus Andrews, Tully GRADE, MD regarding placing her on: Cholesterol tablets, the patient stated she put in a message request, but hasn't heard anything back.  Can we see about supplying the patient with the Cholesterol tablets to assist her further in her care for hypertension?   PCP/PCP Team please advise.

## 2024-04-03 NOTE — Telephone Encounter (Signed)
 Copied from CRM [id number]. Topic: Clinical - Medication Question >> Apr 03, 2024 12:21 PM [name] wrote: Reason for CRM:  Patient was seen yesterday regarding her high blood pressure, and she stated she left a note for Theophilus Andrews, Tully GRADE, MD regarding placing her on: Cholesterol tablets, the patient stated she put in a message request, but hasn't heard anything back.  Can we see about supplying the patient with the Cholesterol tablets to assist her further in her care for hypertension?   PCP/PCP Team please advise. >> Apr 03, 2024  3:40 PM Rosina BIRCH wrote: Patient called stating she went to the doctor on yesterday and her cholesterol was high and she want to be put on medication because she does not want to have a stroke CB [phone]

## 2024-04-04 NOTE — Telephone Encounter (Signed)
 Spoke with the patient.  See other phone note.

## 2024-04-04 NOTE — Telephone Encounter (Signed)
 Spoke to the patient and explained that she should try diet and exercise first, then come back to have her cholesterol checked.  Patient's husband got on the phone.  I explained it to him also.  He said that he was a witness and wanted her to take a pill for her cholesterol.  Should she change doctors?  I told him that it was completely up to him if they wanted to change doctors.  The conversation then ended.

## 2024-04-04 NOTE — Telephone Encounter (Signed)
 Copied from CRM [id number]. Topic: Clinical - Medication Question >> Apr 04, 2024  3:41 PM Franky GRADE wrote: Patient is calling to follow up on the request to speak with Dr.Hernandez Delma regarding results of High Cholesterol. She called multiple times and has not gotten a response.

## 2024-04-08 ENCOUNTER — Ambulatory Visit: Payer: Self-pay | Admitting: Internal Medicine

## 2024-04-08 DIAGNOSIS — E782 Mixed hyperlipidemia: Secondary | ICD-10-CM

## 2024-04-08 MED ORDER — ROSUVASTATIN CALCIUM 5 MG PO TABS: 5.0000 mg | ORAL_TABLET | Freq: Every day | ORAL | 1 refills | Status: AC

## 2024-10-15 ENCOUNTER — Encounter: Admitting: Internal Medicine
